# Patient Record
Sex: Male | Born: 1965 | Race: White | Hispanic: No | Marital: Married | Smoking: Never smoker
Health system: Southern US, Community
[De-identification: ages and names within clinical notes are randomized; demographics above are authoritative.]

## PROBLEM LIST (undated history)

## (undated) DIAGNOSIS — N200 Calculus of kidney: Secondary | ICD-10-CM

## (undated) DIAGNOSIS — E559 Vitamin D deficiency, unspecified: Secondary | ICD-10-CM

## (undated) DIAGNOSIS — M109 Gout, unspecified: Secondary | ICD-10-CM

## (undated) DIAGNOSIS — F419 Anxiety disorder, unspecified: Secondary | ICD-10-CM

## (undated) DIAGNOSIS — E7800 Pure hypercholesterolemia, unspecified: Secondary | ICD-10-CM

## (undated) HISTORY — DX: Anxiety disorder, unspecified: F41.9

## (undated) HISTORY — DX: Vitamin D deficiency, unspecified: E55.9

## (undated) HISTORY — PX: OTHER SURGICAL HISTORY: SHX169

---

## 2011-04-06 ENCOUNTER — Ambulatory Visit (HOSPITAL_COMMUNITY)
Admission: RE | Admit: 2011-04-06 | Discharge: 2011-04-06 | Disposition: A | Discharge status: Home / Self Care | Payer: Managed Care, Other (non HMO) | Source: Ambulatory Visit | Attending: Urology | Admitting: Urology

## 2011-04-06 ENCOUNTER — Other Ambulatory Visit (HOSPITAL_COMMUNITY): Payer: Self-pay | Admitting: Urology

## 2011-04-06 DIAGNOSIS — N201 Calculus of ureter: Secondary | ICD-10-CM

## 2013-03-08 IMAGING — CR DG ABDOMEN 1V
2 series · 2 of 2 positions shown · non-contrast
Comparison: None.

CLINICAL DATA: Left ureteral calculus

ABDOMEN - 1 VIEW

[view not recorded (1 of 2)]
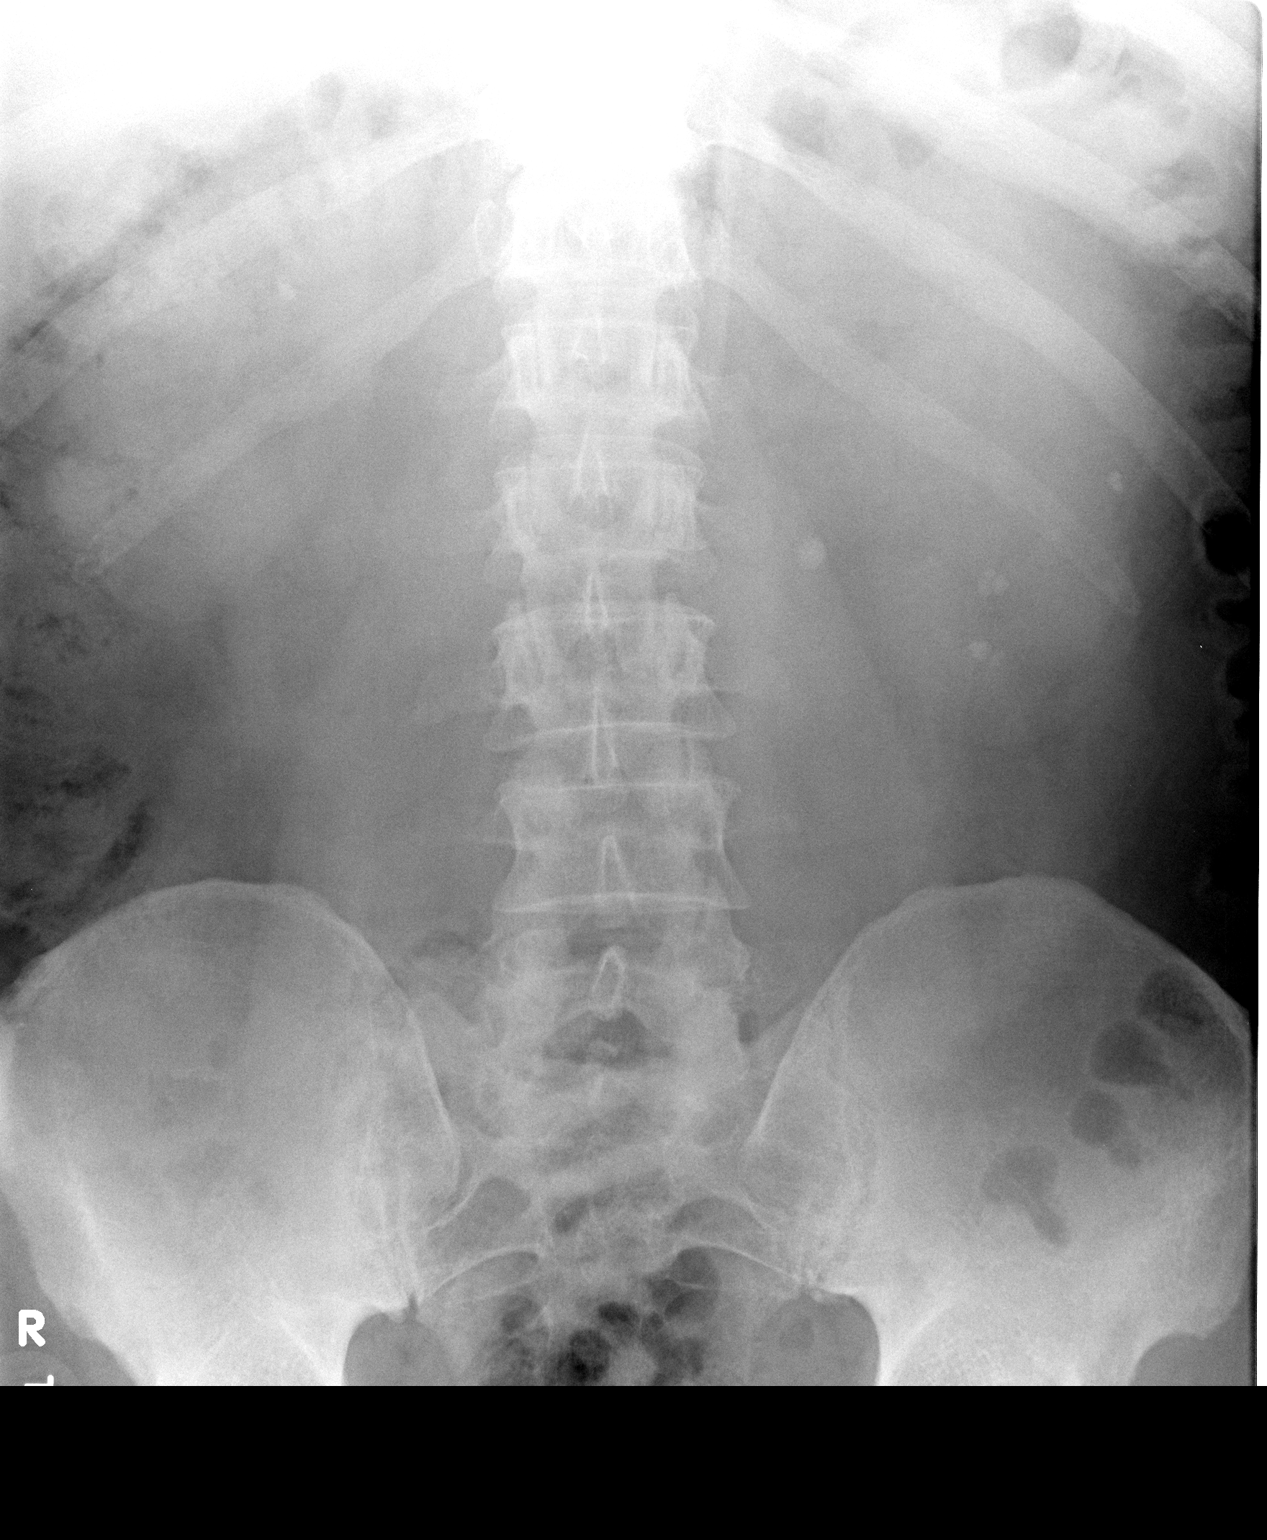

[view not recorded (2 of 2)]
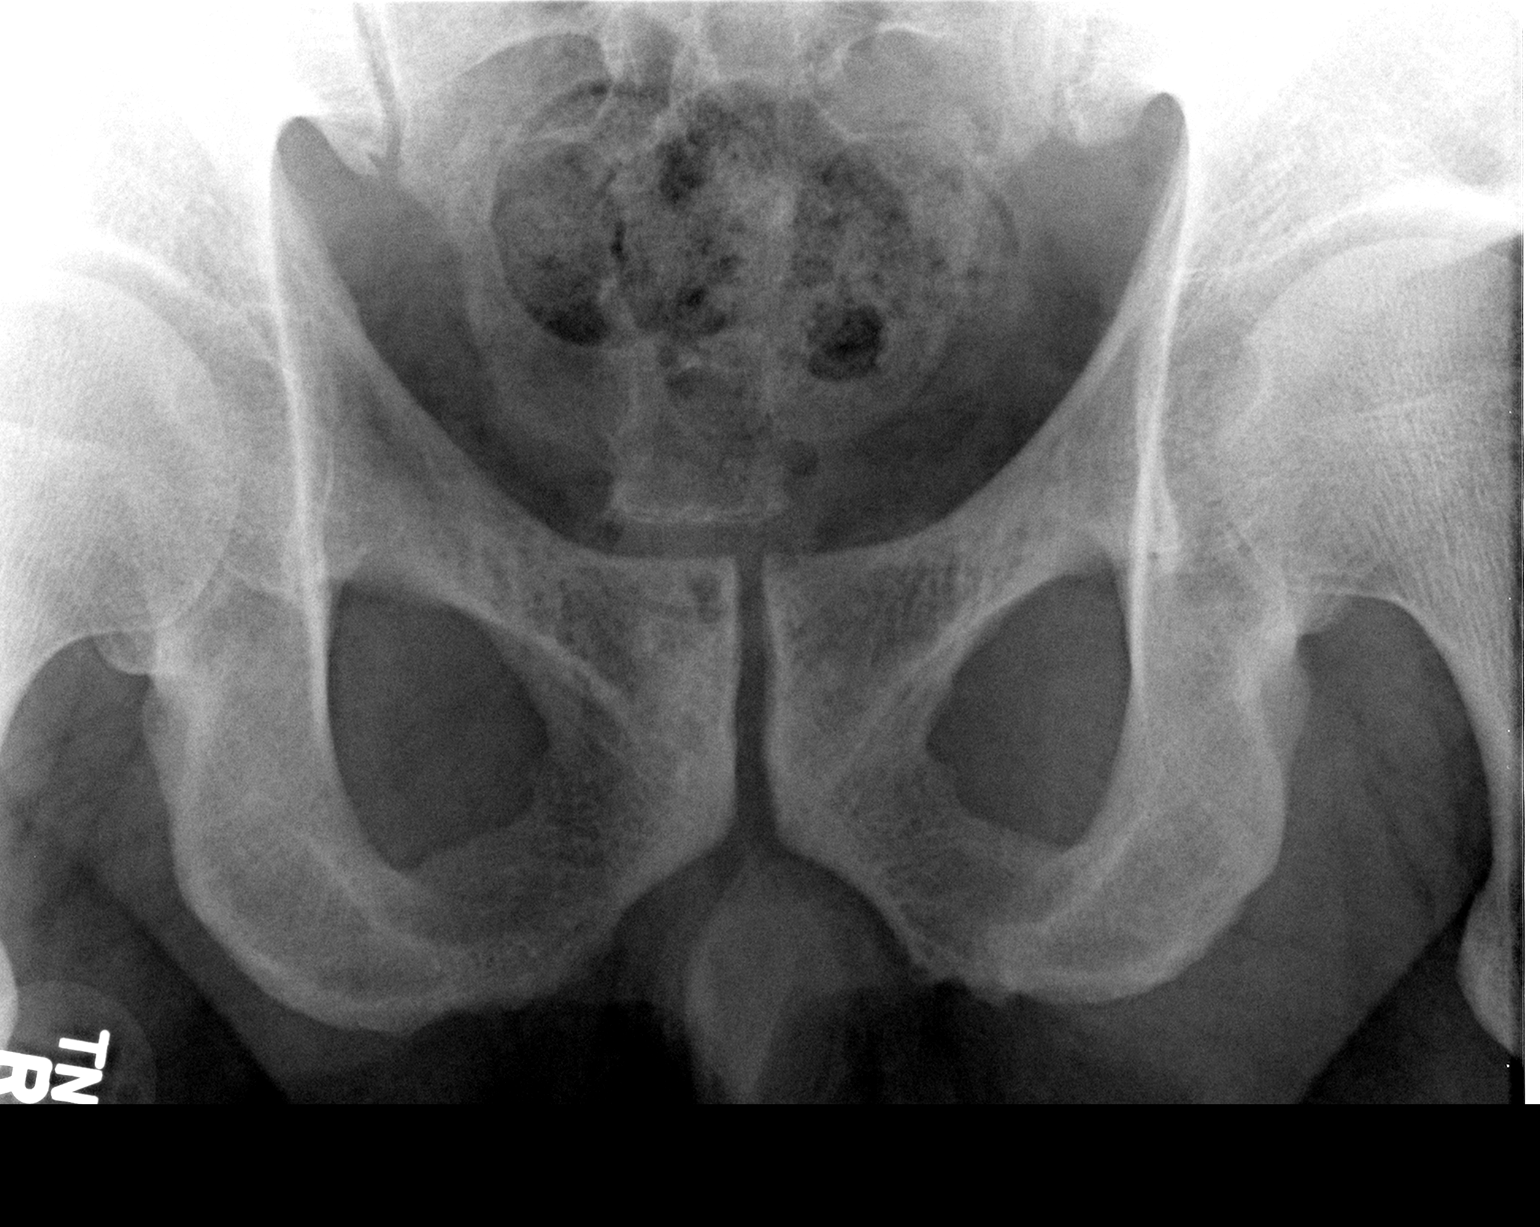

[2 of 2 positions shown; findings below may reference images not displayed]

FINDINGS: 9 mm calculus in the left proximal collecting system.

Additional smaller calculi overlying the left interpolar
region/lower pole.  Additional calculi overlying the right upper
pole.

No definite distal ureteral calculus is seen.
IMPRESSION: 9 mm calculus in the left proximal collecting system.

Additional smaller calculi bilaterally, as described above.

No definite distal ureteral calculus is seen.

## 2016-05-05 ENCOUNTER — Encounter (INDEPENDENT_AMBULATORY_CARE_PROVIDER_SITE_OTHER): Payer: Self-pay | Admitting: *Deleted

## 2016-06-04 ENCOUNTER — Encounter (INDEPENDENT_AMBULATORY_CARE_PROVIDER_SITE_OTHER): Payer: Self-pay | Admitting: *Deleted

## 2016-06-05 ENCOUNTER — Other Ambulatory Visit (INDEPENDENT_AMBULATORY_CARE_PROVIDER_SITE_OTHER): Payer: Self-pay | Admitting: *Deleted

## 2016-06-05 DIAGNOSIS — Z1211 Encounter for screening for malignant neoplasm of colon: Secondary | ICD-10-CM

## 2016-08-21 ENCOUNTER — Telehealth (INDEPENDENT_AMBULATORY_CARE_PROVIDER_SITE_OTHER): Payer: Self-pay | Admitting: *Deleted

## 2016-08-21 ENCOUNTER — Encounter (INDEPENDENT_AMBULATORY_CARE_PROVIDER_SITE_OTHER): Payer: Self-pay | Admitting: *Deleted

## 2016-08-21 NOTE — Telephone Encounter (Signed)
Patient needs trilyte 

## 2016-08-24 MED ORDER — PEG 3350-KCL-NA BICARB-NACL 420 G PO SOLR: 4000.0000 mL | Freq: Once | ORAL | 0 refills | Status: AC

## 2016-09-14 ENCOUNTER — Telehealth (INDEPENDENT_AMBULATORY_CARE_PROVIDER_SITE_OTHER): Payer: Self-pay | Admitting: *Deleted

## 2016-09-14 NOTE — Telephone Encounter (Signed)
Referring MD/PCP: diane blair, np   Procedure: tcs  Reason/Indication:  screening  Has patient had this procedure before?  no  If so, when, by whom and where?    Is there a family history of colon cancer?  no  Who?  What age when diagnosed?    Is patient diabetic?   no      Does patient have prosthetic heart valve or mechanical valve?  no  Do you have a pacemaker?  no  Has patient ever had endocarditis? no  Has patient had joint replacement within last 12 months?  no  Does patient tend to be constipated or take laxatives? no  Does patient have a history of alcohol/drug use?  no  Is patient on Coumadin, Plavix and/or Aspirin? no  Medications: trilipix 135 mg daily, nabumetone 500 mg bid, allopurinol 300 mg bid, krill oil 500 mg daily, vit d 1000 iu daily  Allergies: nkda  Medication Adjustment per Dr Laural Golden:   Procedure date & time: 10/14/16 at 930

## 2016-09-14 NOTE — Telephone Encounter (Signed)
agree

## 2016-10-14 ENCOUNTER — Ambulatory Visit (HOSPITAL_COMMUNITY)
Admission: RE | Admit: 2016-10-14 | Discharge: 2016-10-14 | Disposition: A | Discharge status: Home / Self Care | Payer: Managed Care, Other (non HMO) | Source: Ambulatory Visit | Attending: Internal Medicine | Admitting: Internal Medicine

## 2016-10-14 ENCOUNTER — Encounter (HOSPITAL_COMMUNITY): Payer: Self-pay | Admitting: *Deleted

## 2016-10-14 ENCOUNTER — Encounter (HOSPITAL_COMMUNITY)
Admission: RE | Disposition: A | Discharge status: Home / Self Care | Payer: Self-pay | Source: Ambulatory Visit | Attending: Internal Medicine

## 2016-10-14 DIAGNOSIS — D122 Benign neoplasm of ascending colon: Secondary | ICD-10-CM | POA: Insufficient documentation

## 2016-10-14 DIAGNOSIS — Z79899 Other long term (current) drug therapy: Secondary | ICD-10-CM | POA: Diagnosis not present

## 2016-10-14 DIAGNOSIS — K5730 Diverticulosis of large intestine without perforation or abscess without bleeding: Secondary | ICD-10-CM | POA: Insufficient documentation

## 2016-10-14 DIAGNOSIS — E7800 Pure hypercholesterolemia, unspecified: Secondary | ICD-10-CM | POA: Insufficient documentation

## 2016-10-14 DIAGNOSIS — Z1211 Encounter for screening for malignant neoplasm of colon: Secondary | ICD-10-CM | POA: Diagnosis not present

## 2016-10-14 DIAGNOSIS — M109 Gout, unspecified: Secondary | ICD-10-CM | POA: Insufficient documentation

## 2016-10-14 DIAGNOSIS — D123 Benign neoplasm of transverse colon: Secondary | ICD-10-CM

## 2016-10-14 DIAGNOSIS — Z720 Tobacco use: Secondary | ICD-10-CM | POA: Insufficient documentation

## 2016-10-14 DIAGNOSIS — D120 Benign neoplasm of cecum: Secondary | ICD-10-CM | POA: Diagnosis not present

## 2016-10-14 HISTORY — PX: BIOPSY: SHX5522

## 2016-10-14 HISTORY — PX: POLYPECTOMY: SHX5525

## 2016-10-14 HISTORY — DX: Pure hypercholesterolemia, unspecified: E78.00

## 2016-10-14 HISTORY — PX: COLONOSCOPY: SHX5424

## 2016-10-14 HISTORY — DX: Calculus of kidney: N20.0

## 2016-10-14 HISTORY — DX: Gout, unspecified: M10.9

## 2016-10-14 SURGERY — COLONOSCOPY
Anesthesia: Moderate Sedation

## 2016-10-14 MED ORDER — STERILE WATER FOR IRRIGATION IR SOLN
Status: DC | PRN
  Administered 2016-10-14: 09:00:00

## 2016-10-14 MED ORDER — SODIUM CHLORIDE 0.9 % IV SOLN
INTRAVENOUS | Status: DC
  Administered 2016-10-14: 09:00:00 via INTRAVENOUS

## 2016-10-14 MED ORDER — MEPERIDINE HCL 50 MG/ML IJ SOLN
INTRAMUSCULAR | Status: DC | PRN
  Administered 2016-10-14 (×2): 25 mg via INTRAVENOUS

## 2016-10-14 MED ORDER — MIDAZOLAM HCL 5 MG/5ML IJ SOLN
INTRAMUSCULAR | Status: DC | PRN
  Administered 2016-10-14 (×2): 2 mg via INTRAVENOUS
  Administered 2016-10-14 (×2): 1 mg via INTRAVENOUS

## 2016-10-14 MED ORDER — MEPERIDINE HCL 50 MG/ML IJ SOLN
INTRAMUSCULAR | Status: AC
  Filled 2016-10-14: qty 1

## 2016-10-14 MED ORDER — MIDAZOLAM HCL 5 MG/5ML IJ SOLN
INTRAMUSCULAR | Status: AC
  Filled 2016-10-14: qty 10

## 2016-10-14 NOTE — Op Note (Signed)
[facility] Patient Name: Jimmy Rose Procedure Date: 10/14/2016 9:06 AM MRN: [id number] Date of Birth: 01/08/1966 Attending MD: Hildred Laser , MD CSN: [id number] Age: 51 Admit Type: Outpatient Procedure:                Colonoscopy Indications:              Screening for colorectal malignant neoplasm Providers:                Hildred Laser, MD, Otis Peak B. Sharon Seller, RN, Janeece Riggers, RN Referring MD:             Tempie Hoist, NP Medicines:                Meperidine 50 mg IV, Midazolam 6 mg IV Complications:            No immediate complications. Estimated Blood Loss:     Estimated blood loss was minimal. Procedure:                Pre-Anesthesia Assessment:                           - Prior to the procedure, a History and Physical                            was performed, and patient medications and                            allergies were reviewed. The patient's tolerance of                            previous anesthesia was also reviewed. The risks                            and benefits of the procedure and the sedation                            options and risks were discussed with the patient.                            All questions were answered, and informed consent                            was obtained. Prior Anticoagulants: The patient                            last took previous NSAID medication 3 days prior to                            the procedure. ASA Grade Assessment: II - A patient                            with mild systemic disease. After reviewing the  risks and benefits, the patient was deemed in                            satisfactory condition to undergo the procedure.                           After obtaining informed consent, the colonoscope                            was passed under direct vision. Throughout the                            procedure, the patient's blood pressure, pulse, and                           oxygen saturations were monitored continuously. The                            EC-[id number] ([id number]) scope was introduced through                            the anus and advanced to the the cecum, identified                            by appendiceal orifice and ileocecal valve. The                            colonoscopy was performed without difficulty. The                            patient tolerated the procedure well. The quality                            of the bowel preparation was good. The ileocecal                            valve, appendiceal orifice, and rectum were                            photographed. Scope In: 9:34:47 AM Scope Out: 10:00:35 AM Scope Withdrawal Time: 0 hours 22 minutes 40 seconds  Total Procedure Duration: 0 hours 25 minutes 48 seconds  Findings:      The perianal and digital rectal examinations were normal.      A 10 mm polyp was found in the proximal ascending colon. The polyp was       semi-sessile. The polyp was removed with a hot snare. The polyp was       removed with a piecemeal technique using a hot snare. Resection and       retrieval were complete. The pathology specimen was placed into Bottle       Number 1.      A small polyp was found in the splenic flexure. The polyp was sessile.       The polyp was removed with a cold snare. Resection and retrieval were  complete. The pathology specimen was placed into Bottle Number 3.      A few small-mouthed diverticula were found in the sigmoid colon.      The retroflexed view of the distal rectum and anal verge was normal and       showed no anal or rectal abnormalities.      An area of congested mucosa was found at the ileocecal valve. This was       biopsied with a cold forceps for histology. The pathology specimen was       placed into Bottle Number 2. Impression:               - One 10 mm polyp in the proximal ascending colon,                            removed with a  hot snare and removed piecemeal                            using a hot snare. Resected and retrieved.                           - One small polyp at the splenic flexure, removed                            with a cold snare. Resected and retrieved.                           - Diverticulosis in the sigmoid colon.                           - Biopsy taken from edematous mucosa at ileocecal                            valve. Moderate Sedation:      Moderate (conscious) sedation was administered by the endoscopy nurse       and supervised by the endoscopist. The following parameters were       monitored: oxygen saturation, heart rate, blood pressure, CO2       capnography and response to care. Total physician intraservice time was       32 minutes. Recommendation:           - Patient has a contact number available for                            emergencies. The signs and symptoms of potential                            delayed complications were discussed with the                            patient. Return to normal activities tomorrow.                            Written discharge instructions were provided to the  patient.                           - High fiber diet today.                           - Continue present medications.                           - No aspirin, ibuprofen, naproxen, or other                            non-steroidal anti-inflammatory drugs for 3 days                            after polyp removal.                           - Await pathology results.                           - Repeat colonoscopy date to be determined after                            pending pathology results are reviewed. Procedure Code(s):        --- Professional ---                           [phone], Colonoscopy, flexible; with removal of                            tumor(s), polyp(s), or other lesion(s) by snare                            technique                            99152, Moderate sedation services provided by the                            same physician or other qualified health care                            professional performing the diagnostic or                            therapeutic service that the sedation supports,                            requiring the presence of an independent trained                            observer to assist in the monitoring of the                            patient's level of consciousness and physiological  status; initial 15 minutes of intraservice time,                            patient age 35 years or older                           [phone], Moderate sedation services; each additional                            15 minutes intraservice time Diagnosis Code(s):        --- Professional ---                           Z12.11, Encounter for screening for malignant                            neoplasm of colon                           D12.2, Benign neoplasm of ascending colon                           D12.3, Benign neoplasm of transverse colon (hepatic                            flexure or splenic flexure)                           K57.30, Diverticulosis of large intestine without                            perforation or abscess without bleeding CPT copyright 2016 [employer]. All rights reserved. The codes documented in this report are preliminary and upon coder review may  be revised to meet current compliance requirements. Hildred Laser, MD Hildred Laser, MD 10/14/2016 10:12:30 AM This report has been signed electronically. Number of Addenda: 0

## 2016-10-14 NOTE — Discharge Instructions (Signed)
Colon Polyps Polyps are tissue growths inside the body. Polyps can grow in many places, including the large intestine (colon). A polyp may be a round bump or a mushroom-shaped growth. You could have one polyp or several. Most colon polyps are noncancerous (benign). However, some colon polyps can become cancerous over time. What are the causes? The exact cause of colon polyps is not known. What increases the risk? This condition is more likely to develop in people who:  Have a family history of colon cancer or colon polyps.  Are older than 69 or older than 45 if they are African American.  Have inflammatory bowel disease, such as ulcerative colitis or Crohn disease.  Are overweight.  Smoke cigarettes.  Do not get enough exercise.  Drink too much alcohol.  Eat a diet that is: ? High in fat and red meat. ? Low in fiber.  Had childhood cancer that was treated with abdominal radiation.  What are the signs or symptoms? Most polyps do not cause symptoms. If you have symptoms, they may include:  Blood coming from your rectum when having a bowel movement.  Blood in your stool.The stool may look dark red or black.  A change in bowel habits, such as constipation or diarrhea.  How is this diagnosed? This condition is diagnosed with a colonoscopy. This is a procedure that uses a lighted, flexible scope to look at the inside of your colon. How is this treated? Treatment for this condition involves removing any polyps that are found. Those polyps will then be tested for cancer. If cancer is found, your health care provider will talk to you about options for colon cancer treatment. Follow these instructions at home: Diet  Eat plenty of fiber, such as fruits, vegetables, and whole grains.  Eat foods that are high in calcium and vitamin D, such as milk, cheese, yogurt, eggs, liver, fish, and broccoli.  Limit foods high in fat, red meats, and processed meats, such as hot dogs, sausage,  bacon, and lunch meats.  Maintain a healthy weight, or lose weight if recommended by your health care provider. General instructions  Do not smoke cigarettes.  Do not drink alcohol excessively.  Keep all follow-up visits as told by your health care provider. This is important. This includes keeping regularly scheduled colonoscopies. Talk to your health care provider about when you need a colonoscopy.  Exercise every day or as told by your health care provider. Contact a health care provider if:  You have new or worsening bleeding during a bowel movement.  You have new or increased blood in your stool.  You have a change in bowel habits.  You unexpectedly lose weight. This information is not intended to replace advice given to you by your health care provider. Make sure you discuss any questions you have with your health care provider. Document Released: 10/02/2003 Document Revised: 06/13/2015 Document Reviewed: 11/26/2014 Elsevier Interactive Patient Education  2018 [employer].    Colonoscopy, Adult, Care After This sheet gives you information about how to care for yourself after your procedure. Your health care provider may also give you more specific instructions. If you have problems or questions, contact your health care provider. What can I expect after the procedure? After the procedure, it is common to have:  A small amount of blood in your stool for 24 hours after the procedure.  Some gas.  Mild abdominal cramping or bloating.  Follow these instructions at home: General instructions   For the first  24 hours after the procedure: ? Do not drive or use machinery. ? Do not sign important documents. ? Do not drink alcohol. ? Do your regular daily activities at a slower pace than normal. ? Eat soft, easy-to-digest foods. ? Rest often.  Take over-the-counter or prescription medicines only as told by your health care provider.  It is up to you to get the results  of your procedure. Ask your health care provider, or the department performing the procedure, when your results will be ready. Relieving cramping and bloating  Try walking around when you have cramps or feel bloated.  Apply heat to your abdomen as told by your health care provider. Use a heat source that your health care provider recommends, such as a moist heat pack or a heating pad. ? Place a towel between your skin and the heat source. ? Leave the heat on for 20-30 minutes. ? Remove the heat if your skin turns bright red. This is especially important if you are unable to feel pain, heat, or cold. You may have a greater risk of getting burned. Eating and drinking  Drink enough fluid to keep your urine clear or pale yellow.  Resume your normal diet as instructed by your health care provider. Avoid heavy or fried foods that are hard to digest.  Avoid drinking alcohol for as long as instructed by your health care provider. Contact a health care provider if:  You have blood in your stool 2-3 days after the procedure. Get help right away if:  You have more than a small spotting of blood in your stool.  You pass large blood clots in your stool.  Your abdomen is swollen.  You have nausea or vomiting.  You have a fever.  You have increasing abdominal pain that is not relieved with medicine. This information is not intended to replace advice given to you by your health care provider. Make sure you discuss any questions you have with your health care provider. Document Released: 08/20/2003 Document Revised: 09/30/2015 Document Reviewed: 03/19/2015 Elsevier Interactive Patient Education  2018 [employer]. No aspirin for 1 week. Avoid other NSAIDs for at least 3 days. Resume other medications as before. High fiber diet. No driving for 24 hours. Physician will call biopsy results.

## 2016-10-14 NOTE — H&P (Signed)
Jimmy Rose is an 51 y.o. male.   Chief Complaint: patient is here for colonoscopy. HPI: patient is 51 year old Caucasian male who is here for screening colonoscopy. He denies abdominal pain change in bowel habits or rectal bleeding. Family history is negative for CRC.  Past Medical History:  Diagnosis Date  . Gout   . Hypercholesteremia   . Kidney stones     Past Surgical History:  Procedure Laterality Date  . left finger reattached      Family History  Problem Relation Age of Onset  . Colon cancer Neg Hx    Social History:  reports that he has never smoked. His smokeless tobacco use includes Snuff. He reports that he drinks about 14.4 oz of alcohol per week . He reports that he does not use drugs.  Allergies: No Known Allergies  Medications Prior to Admission  Medication Sig Dispense Refill  . allopurinol (ZYLOPRIM) 300 MG tablet Take 300 mg by mouth daily.    . Choline Fenofibrate 135 MG capsule Take 135 mg by mouth daily.    Javier Docker Oil 500 MG CAPS Take 1 capsule by mouth daily.    . nabumetone (RELAFEN) 500 MG tablet Take 500 mg by mouth 2 (two) times daily.      No results found for this or any previous visit (from the past 48 hour(s)). No results found.  ROS  Blood pressure 122/89, pulse 65, temperature 98.9 F (37.2 C), temperature source Oral, resp. rate 12, height 5\' 10"  (1.778 m), weight 250 lb (113.4 kg), SpO2 96 %. Physical Exam  Constitutional: He appears well-developed and well-nourished.  HENT:  Mouth/Throat: Oropharynx is clear and moist.  Eyes: Conjunctivae are normal. No scleral icterus.  Neck: No thyromegaly present.  Cardiovascular: Normal rate, regular rhythm and normal heart sounds.   No murmur heard. Respiratory: Effort normal and breath sounds normal.  GI:  Abdomen is full. It is soft and nontender without organomegaly or masses  Musculoskeletal: He exhibits no edema.  Lymphadenopathy:    He has no cervical adenopathy.  Neurological:  He is alert.  Skin: Skin is warm and dry.     Assessment/Plan Average risk screening colonoscopy.  Hildred Laser, MD 10/14/2016, 9:25 AM

## 2016-10-16 ENCOUNTER — Encounter (HOSPITAL_COMMUNITY): Payer: Self-pay | Admitting: Internal Medicine

## 2019-10-19 ENCOUNTER — Encounter (INDEPENDENT_AMBULATORY_CARE_PROVIDER_SITE_OTHER): Payer: Self-pay | Admitting: *Deleted

## 2020-01-18 ENCOUNTER — Encounter (INDEPENDENT_AMBULATORY_CARE_PROVIDER_SITE_OTHER): Payer: Self-pay

## 2020-01-18 ENCOUNTER — Telehealth (INDEPENDENT_AMBULATORY_CARE_PROVIDER_SITE_OTHER): Payer: Self-pay

## 2020-01-18 ENCOUNTER — Other Ambulatory Visit (INDEPENDENT_AMBULATORY_CARE_PROVIDER_SITE_OTHER): Payer: Self-pay

## 2020-01-18 DIAGNOSIS — Z1211 Encounter for screening for malignant neoplasm of colon: Secondary | ICD-10-CM

## 2020-01-18 MED ORDER — SUPREP BOWEL PREP KIT 17.5-3.13-1.6 GM/177ML PO SOLN: 1.0000 | ORAL | 0 refills | Status: DC

## 2020-01-18 NOTE — Telephone Encounter (Signed)
Referring MD/PCP: diane blair   Procedure: tcs  Reason/Indication:  History of polyps  Has patient had this procedure before?  yes  If so, when, by whom and where?  2018  Is there a family history of colon cancer?  no  Who?  What age when diagnosed?    Is patient diabetic?   no      Does patient have prosthetic heart valve or mechanical valve?  no  Do you have a pacemaker/defibrillator?  no  Has patient ever had endocarditis/atrial fibrillation? no  Have you had a stroke/heart attack last 6 mths? no  Does patient use oxygen? no  Has patient had joint replacement within last 12 months?  no  Is patient constipated or do they take laxatives? no  Does patient have a history of alcohol/drug use?  no  Is patient on blood thinner such as Coumadin, Plavix and/or Aspirin? no  Medications: allopruinol 300 mg bid, fenofibric acid 135 mg daily, nabumetone 500 mg bid, krill oil 500 mg daily  Allergies: nkda  Procedure date & time: 02/07/20 7:30

## 2020-01-18 NOTE — Telephone Encounter (Signed)
LeighAnn Vann Okerlund, CMA  

## 2020-01-31 ENCOUNTER — Encounter (INDEPENDENT_AMBULATORY_CARE_PROVIDER_SITE_OTHER): Payer: Self-pay

## 2020-02-05 ENCOUNTER — Other Ambulatory Visit (HOSPITAL_COMMUNITY): Payer: Managed Care, Other (non HMO)

## 2020-02-05 ENCOUNTER — Encounter (HOSPITAL_COMMUNITY): Payer: Managed Care, Other (non HMO)

## 2020-04-08 ENCOUNTER — Encounter (HOSPITAL_COMMUNITY): Payer: Self-pay

## 2020-04-08 ENCOUNTER — Other Ambulatory Visit (HOSPITAL_COMMUNITY)
Admission: RE | Admit: 2020-04-08 | Discharge: 2020-04-08 | Disposition: A | Discharge status: Home / Self Care | Payer: Managed Care, Other (non HMO) | Source: Ambulatory Visit | Attending: Internal Medicine | Admitting: Internal Medicine

## 2020-04-08 ENCOUNTER — Other Ambulatory Visit: Payer: Self-pay

## 2020-04-08 ENCOUNTER — Encounter (HOSPITAL_COMMUNITY)
Admission: RE | Admit: 2020-04-08 | Discharge: 2020-04-08 | Disposition: A | Discharge status: Home / Self Care | Payer: Managed Care, Other (non HMO) | Source: Ambulatory Visit | Attending: Internal Medicine | Admitting: Internal Medicine

## 2020-04-08 DIAGNOSIS — Z01812 Encounter for preprocedural laboratory examination: Secondary | ICD-10-CM | POA: Insufficient documentation

## 2020-04-08 DIAGNOSIS — Z20822 Contact with and (suspected) exposure to covid-19: Secondary | ICD-10-CM | POA: Insufficient documentation

## 2020-04-09 LAB — SARS CORONAVIRUS 2 (TAT 6-24 HRS): SARS Coronavirus 2: NEGATIVE

## 2020-04-10 ENCOUNTER — Encounter (HOSPITAL_COMMUNITY): Payer: Self-pay | Admitting: Internal Medicine

## 2020-04-10 ENCOUNTER — Encounter (HOSPITAL_COMMUNITY)
Admission: RE | Disposition: A | Discharge status: Home / Self Care | Payer: Self-pay | Source: Ambulatory Visit | Attending: Internal Medicine

## 2020-04-10 ENCOUNTER — Ambulatory Visit (HOSPITAL_COMMUNITY): Payer: Managed Care, Other (non HMO) | Admitting: Certified Registered Nurse Anesthetist

## 2020-04-10 ENCOUNTER — Other Ambulatory Visit: Payer: Self-pay

## 2020-04-10 ENCOUNTER — Ambulatory Visit (HOSPITAL_COMMUNITY)
Admission: RE | Admit: 2020-04-10 | Discharge: 2020-04-10 | Disposition: A | Discharge status: Home / Self Care | Payer: Managed Care, Other (non HMO) | Source: Ambulatory Visit | Attending: Internal Medicine | Admitting: Internal Medicine

## 2020-04-10 DIAGNOSIS — Z86010 Personal history of colonic polyps: Secondary | ICD-10-CM | POA: Insufficient documentation

## 2020-04-10 DIAGNOSIS — Z09 Encounter for follow-up examination after completed treatment for conditions other than malignant neoplasm: Secondary | ICD-10-CM | POA: Insufficient documentation

## 2020-04-10 DIAGNOSIS — Z1211 Encounter for screening for malignant neoplasm of colon: Secondary | ICD-10-CM

## 2020-04-10 DIAGNOSIS — D128 Benign neoplasm of rectum: Secondary | ICD-10-CM | POA: Insufficient documentation

## 2020-04-10 DIAGNOSIS — K621 Rectal polyp: Secondary | ICD-10-CM | POA: Diagnosis not present

## 2020-04-10 DIAGNOSIS — F17290 Nicotine dependence, other tobacco product, uncomplicated: Secondary | ICD-10-CM | POA: Insufficient documentation

## 2020-04-10 DIAGNOSIS — D120 Benign neoplasm of cecum: Secondary | ICD-10-CM | POA: Diagnosis not present

## 2020-04-10 DIAGNOSIS — D125 Benign neoplasm of sigmoid colon: Secondary | ICD-10-CM | POA: Diagnosis not present

## 2020-04-10 DIAGNOSIS — D123 Benign neoplasm of transverse colon: Secondary | ICD-10-CM | POA: Insufficient documentation

## 2020-04-10 DIAGNOSIS — Z791 Long term (current) use of non-steroidal anti-inflammatories (NSAID): Secondary | ICD-10-CM | POA: Insufficient documentation

## 2020-04-10 DIAGNOSIS — K644 Residual hemorrhoidal skin tags: Secondary | ICD-10-CM | POA: Insufficient documentation

## 2020-04-10 DIAGNOSIS — M109 Gout, unspecified: Secondary | ICD-10-CM | POA: Insufficient documentation

## 2020-04-10 DIAGNOSIS — K5730 Diverticulosis of large intestine without perforation or abscess without bleeding: Secondary | ICD-10-CM | POA: Insufficient documentation

## 2020-04-10 HISTORY — PX: COLONOSCOPY WITH PROPOFOL: SHX5780

## 2020-04-10 HISTORY — PX: POLYPECTOMY: SHX5525

## 2020-04-10 LAB — HM COLONOSCOPY

## 2020-04-10 SURGERY — COLONOSCOPY WITH PROPOFOL
Anesthesia: General

## 2020-04-10 MED ORDER — CHLORHEXIDINE GLUCONATE CLOTH 2 % EX PADS: 6.0000 | MEDICATED_PAD | Freq: Once | CUTANEOUS | Status: DC

## 2020-04-10 MED ORDER — LACTATED RINGERS IV SOLN: INTRAVENOUS | Status: DC | PRN

## 2020-04-10 MED ORDER — LACTATED RINGERS IV SOLN: INTRAVENOUS | Status: DC

## 2020-04-10 MED ORDER — PROPOFOL 10 MG/ML IV BOLUS
INTRAVENOUS | Status: DC | PRN
  Administered 2020-04-10: 100 mg via INTRAVENOUS
  Administered 2020-04-10: 30 mg via INTRAVENOUS

## 2020-04-10 MED ORDER — PROPOFOL 500 MG/50ML IV EMUL
INTRAVENOUS | Status: DC | PRN
  Administered 2020-04-10: 125 ug/kg/min via INTRAVENOUS

## 2020-04-10 NOTE — Anesthesia Preprocedure Evaluation (Signed)
Anesthesia Evaluation  Patient identified by MRN, date of birth, ID band Patient awake    Reviewed: Allergy & Precautions, H&P , NPO status , Patient's Chart, lab work & pertinent test results, reviewed documented beta blocker date and time   Airway Mallampati: II  TM Distance: >3 FB Neck ROM: full    Dental no notable dental hx.    Pulmonary neg pulmonary ROS,    Pulmonary exam normal breath sounds clear to auscultation       Cardiovascular Exercise Tolerance: Good negative cardio ROS   Rhythm:regular Rate:Normal     Neuro/Psych negative neurological ROS  negative psych ROS   GI/Hepatic negative GI ROS, Neg liver ROS,   Endo/Other  Morbid obesity  Renal/GU Renal disease  negative genitourinary   Musculoskeletal   Abdominal   Peds  Hematology negative hematology ROS (+)   Anesthesia Other Findings   Reproductive/Obstetrics negative OB ROS                             Anesthesia Physical Anesthesia Plan  ASA: III  Anesthesia Plan: General   Post-op Pain Management:    Induction:   PONV Risk Score and Plan: Propofol infusion  Airway Management Planned:   Additional Equipment:   Intra-op Plan:   Post-operative Plan:   Informed Consent: I have reviewed the patients History and Physical, chart, labs and discussed the procedure including the risks, benefits and alternatives for the proposed anesthesia with the patient or authorized representative who has indicated his/her understanding and acceptance.     Dental Advisory Given  Plan Discussed with: CRNA  Anesthesia Plan Comments:         Anesthesia Quick Evaluation

## 2020-04-10 NOTE — Anesthesia Postprocedure Evaluation (Signed)
Anesthesia Post Note  Patient: Jimmy Mancia  Procedure(s) Performed: COLONOSCOPY WITH PROPOFOL (N/A ) POLYPECTOMY  Patient location during evaluation: Phase II Anesthesia Type: General Level of consciousness: awake and alert Pain management: satisfactory to patient Vital Signs Assessment: post-procedure vital signs reviewed and stable Respiratory status: spontaneous breathing and respiratory function stable Cardiovascular status: blood pressure returned to baseline and stable Postop Assessment: no apparent nausea or vomiting Anesthetic complications: no   No complications documented.   Last Vitals:  Vitals:   04/10/20 0751 04/10/20 0856  BP: 129/81 110/73  Pulse: 77 73  Resp: 17 13  Temp: 36.8 C 37 C  SpO2: 98% 95%    Last Pain:  Vitals:   04/10/20 0856  TempSrc: Oral  PainSc: 0-No pain                 Karna Dupes

## 2020-04-10 NOTE — Discharge Instructions (Signed)
No aspirin or NSAIDs for 24 hours. Resume other medications as before. High-fiber diet. No driving for 24 hours. Physician will call with biopsy results.     Colonoscopy, Adult, Care After This sheet gives you information about how to care for yourself after your procedure. Your doctor may also give you more specific instructions. If you have problems or questions, call your doctor. What can I expect after the procedure? After the procedure, it is common to have:  A small amount of blood in your poop (stool) for 24 hours.  Some gas.  Mild cramping or bloating in your belly (abdomen). Follow these instructions at home: Eating and drinking  Drink enough fluid to keep your pee (urine) pale yellow.  Follow instructions from your doctor about what you cannot eat or drink.  Return to your normal diet as told by your doctor. Avoid heavy or fried foods that are hard to digest.   Activity  Rest as told by your doctor.  Do not sit for a long time without moving. Get up to take short walks every 1-2 hours. This is important. Ask for help if you feel weak or unsteady.  Return to your normal activities as told by your doctor. Ask your doctor what activities are safe for you. To help cramping and bloating:  Try walking around.  Put heat on your belly as told by your doctor. Use the heat source that your doctor recommends, such as a moist heat pack or a heating pad. ? Put a towel between your skin and the heat source. ? Leave the heat on for 20-30 minutes. ? Remove the heat if your skin turns bright red. This is very important if you are unable to feel pain, heat, or cold. You may have a greater risk of getting burned.   General instructions  If you were given a medicine to help you relax (sedative) during your procedure, it can affect you for many hours. Do not drive or use machinery until your doctor says that it is safe.  For the first 24 hours after the procedure: ? Do not sign  important documents. ? Do not drink alcohol. ? Do your daily activities more slowly than normal. ? Eat foods that are soft and easy to digest.  Take over-the-counter or prescription medicines only as told by your doctor.  Keep all follow-up visits as told by your doctor. This is important. Contact a doctor if:  You have blood in your poop 2-3 days after the procedure. Get help right away if:  You have more than a small amount of blood in your poop.  You see large clumps of tissue (blood clots) in your poop.  Your belly is swollen.  You feel like you may vomit (nauseous).  You vomit.  You have a fever.  You have belly pain that gets worse, and medicine does not help your pain. Summary  After the procedure, it is common to have a small amount of blood in your poop. You may also have mild cramping and bloating in your belly.  If you were given a medicine to help you relax (sedative) during your procedure, it can affect you for many hours. Do not drive or use machinery until your doctor says that it is safe.  Get help right away if you have a lot of blood in your poop, feel like you may vomit, have a fever, or have more belly pain. This information is not intended to replace advice given to you  by your health care provider. Make sure you discuss any questions you have with your health care provider. Document Revised: 11/11/2018 Document Reviewed: 08/01/2018 Elsevier Patient Education  [place].      High-Fiber Eating Plan Fiber, also called dietary fiber, is a type of carbohydrate. It is found foods such as fruits, vegetables, whole grains, and beans. A high-fiber diet can have many health benefits. Your health care provider may recommend a high-fiber diet to help:  Prevent constipation. Fiber can make your bowel movements more regular.  Lower your cholesterol.  Relieve the following conditions: ? Inflammation of veins in the anus (hemorrhoids). ? Inflammation  of specific areas of the digestive tract (uncomplicated diverticulosis). ? A problem of the large intestine, also called the colon, that sometimes causes pain and diarrhea (irritable bowel syndrome, or IBS).  Prevent overeating as part of a weight-loss plan.  Prevent heart disease, type 2 diabetes, and certain cancers. What are tips for following this plan? Reading food labels  Check the nutrition facts label on food products for the amount of dietary fiber. Choose foods that have 5 grams of fiber or more per serving.  The goals for recommended daily fiber intake include: ? Men (age 60 or younger): 34-38 g. ? Men (over age 66): 28-34 g. ? Women (age 27 or younger): 25-28 g. ? Women (over age 69): 22-25 g. Your daily fiber goal is _____________ g.   Shopping  Choose whole fruits and vegetables instead of processed forms, such as apple juice or applesauce.  Choose a wide variety of high-fiber foods such as avocados, lentils, oats, and kidney beans.  Read the nutrition facts label of the foods you choose. Be aware of foods with added fiber. These foods often have high sugar and sodium amounts per serving. Cooking  Use whole-grain flour for baking and cooking.  Cook with brown rice instead of white rice. Meal planning  Start the day with a breakfast that is high in fiber, such as a cereal that contains 5 g of fiber or more per serving.  Eat breads and cereals that are made with whole-grain flour instead of refined flour or white flour.  Eat brown rice, bulgur wheat, or millet instead of white rice.  Use beans in place of meat in soups, salads, and pasta dishes.  Be sure that half of the grains you eat each day are whole grains. General information  You can get the recommended daily intake of dietary fiber by: ? Eating a variety of fruits, vegetables, grains, nuts, and beans. ? Taking a fiber supplement if you are not able to take in enough fiber in your diet. It is better to  get fiber through food than from a supplement.  Gradually increase how much fiber you consume. If you increase your intake of dietary fiber too quickly, you may have bloating, cramping, or gas.  Drink plenty of water to help you digest fiber.  Choose high-fiber snacks, such as berries, raw vegetables, nuts, and popcorn. What foods should I eat? Fruits Berries. Pears. Apples. Oranges. Avocado. Prunes and raisins. Dried figs. Vegetables Sweet potatoes. Spinach. Kale. Artichokes. Cabbage. Broccoli. Cauliflower. Green peas. Carrots. Squash. Grains Whole-grain breads. Multigrain cereal. Oats and oatmeal. Brown rice. Barley. Bulgur wheat. [place]. Quinoa. Bran muffins. Popcorn. Rye wafer crackers. Meats and other proteins Navy beans, kidney beans, and pinto beans. Soybeans. Split peas. Lentils. Nuts and seeds. Dairy Fiber-fortified yogurt. Beverages Fiber-fortified soy milk. Fiber-fortified orange juice. Other foods Fiber bars. The items listed  above may not be a complete list of recommended foods and beverages. Contact a dietitian for more information. What foods should I avoid? Fruits Fruit juice. Cooked, strained fruit. Vegetables Fried potatoes. Canned vegetables. Well-cooked vegetables. Grains White bread. Pasta made with refined flour. White rice. Meats and other proteins Fatty cuts of meat. Fried chicken or fried fish. Dairy Milk. Yogurt. Cream cheese. Sour cream. Fats and oils Butters. Beverages Soft drinks. Other foods Cakes and pastries. The items listed above may not be a complete list of foods and beverages to avoid. Talk with your dietitian about what choices are best for you. Summary  Fiber is a type of carbohydrate. It is found in foods such as fruits, vegetables, whole grains, and beans.  A high-fiber diet has many benefits. It can help to prevent constipation, lower blood cholesterol, aid weight loss, and reduce your risk of heart disease, diabetes, and certain  cancers.  Increase your intake of fiber gradually. Increasing fiber too quickly may cause cramping, bloating, and gas. Drink plenty of water while you increase the amount of fiber you consume.  The best sources of fiber include whole fruits and vegetables, whole grains, nuts, seeds, and beans. This information is not intended to replace advice given to you by your health care provider. Make sure you discuss any questions you have with your health care provider. Document Revised: 05/11/2019 Document Reviewed: 05/11/2019 Elsevier Patient Education  2021 [place].    Colon Polyps  Colon polyps are tissue growths inside the colon, which is part of the large intestine. They are one of the types of polyps that can grow in the body. A polyp may be a round bump or a mushroom-shaped growth. You could have one polyp or more than one. Most colon polyps are noncancerous (benign). However, some colon polyps can become cancerous over time. Finding and removing the polyps early can help prevent this. What are the causes? The exact cause of colon polyps is not known. What increases the risk? The following factors may make you more likely to develop this condition:  Having a family history of colorectal cancer or colon polyps.  Being older than 55 years of age.  Being younger than 55 years of age and having a significant family history of colorectal cancer or colon polyps or a genetic condition that puts you at higher risk of getting colon polyps.  Having inflammatory bowel disease, such as ulcerative colitis or Crohn's disease.  Having certain conditions passed from parent to child (hereditary conditions), such as: ? Familial adenomatous polyposis (FAP). ? Lynch syndrome. ? Turcot syndrome. ? Peutz-Jeghers syndrome. ? MUTYH-associated polyposis (MAP).  Being overweight.  Certain lifestyle factors. These include smoking cigarettes, drinking too much alcohol, not getting enough exercise, and  eating a diet that is high in fat and red meat and low in fiber.  Having had childhood cancer that was treated with radiation of the abdomen. What are the signs or symptoms? Many times, there are no symptoms. If you have symptoms, they may include:  Blood coming from the rectum during a bowel movement.  Blood in the stool (feces). The blood may be bright red or very dark in color.  Pain in the abdomen.  A change in bowel habits, such as constipation or diarrhea. How is this diagnosed? This condition is diagnosed with a colonoscopy. This is a procedure in which a lighted, flexible scope is inserted into the opening between the buttocks (anus) and then passed into the colon to examine  the area. Polyps are sometimes found when a colonoscopy is done as part of routine cancer screening tests. How is this treated? This condition is treated by removing any polyps that are found. Most polyps can be removed during a colonoscopy. Those polyps will then be tested for cancer. Additional treatment may be needed depending on the results of testing. Follow these instructions at home: Eating and drinking  Eat foods that are high in fiber, such as fruits, vegetables, and whole grains.  Eat foods that are high in calcium and vitamin D, such as milk, cheese, yogurt, eggs, liver, fish, and broccoli.  Limit foods that are high in fat, such as fried foods and desserts.  Limit the amount of red meat, precooked or cured meat, or other processed meat that you eat, such as hot dogs, sausages, bacon, or meat loaves.  Limit sugary drinks.   Lifestyle  Maintain a healthy weight, or lose weight if recommended by your health care provider.  Exercise every day or as told by your health care provider.  Do not use any products that contain nicotine or tobacco, such as cigarettes, e-cigarettes, and chewing tobacco. If you need help quitting, ask your health care provider.  Do not drink alcohol if: ? Your health  care provider tells you not to drink. ? You are pregnant, may be pregnant, or are planning to become pregnant.  If you drink alcohol: ? Limit how much you use to:  0-1 drink a day for women.  0-2 drinks a day for men. ? Know how much alcohol is in your drink. In the [place] one drink equals one 12 oz bottle of beer (355 mL), one 5 oz glass of wine (148 mL), or one 1 oz glass of hard liquor (44 mL). General instructions  Take over-the-counter and prescription medicines only as told by your health care provider.  Keep all follow-up visits. This is important. This includes having regularly scheduled colonoscopies. Talk to your health care provider about when you need a colonoscopy. Contact a health care provider if:  You have new or worsening bleeding during a bowel movement.  You have new or increased blood in your stool.  You have a change in bowel habits.  You lose weight for no known reason. Summary  Colon polyps are tissue growths inside the colon, which is part of the large intestine. They are one type of polyp that can grow in the body.  Most colon polyps are noncancerous (benign), but some can become cancerous over time.  This condition is diagnosed with a colonoscopy.  This condition is treated by removing any polyps that are found. Most polyps can be removed during a colonoscopy. This information is not intended to replace advice given to you by your health care provider. Make sure you discuss any questions you have with your health care provider. Document Revised: 04/26/2019 Document Reviewed: 04/26/2019 Elsevier Patient Education  2021 [place] After This sheet gives you information about how to care for yourself after your procedure. Your health care provider may also give you more specific instructions. If you have problems or questions, contact your health care provider. What can I expect after the procedure? After the  procedure, it is common to have:  Tiredness.  Forgetfulness about what happened after the procedure.  Impaired judgment for important decisions.  Nausea or vomiting.  Some difficulty with balance. Follow these instructions at home: For the time period you were told by  your health care provider:  Rest as needed.  Do not participate in activities where you could fall or become injured.  Do not drive or use machinery.  Do not drink alcohol.  Do not take sleeping pills or medicines that cause drowsiness.  Do not make important decisions or sign legal documents.  Do not take care of children on your own.      Eating and drinking  Follow the diet that is recommended by your health care provider.  Drink enough fluid to keep your urine pale yellow.  If you vomit: ? Drink water, juice, or soup when you can drink without vomiting. ? Make sure you have little or no nausea before eating solid foods. General instructions  Have a responsible adult stay with you for the time you are told. It is important to have someone help care for you until you are awake and alert.  Take over-the-counter and prescription medicines only as told by your health care provider.  If you have sleep apnea, surgery and certain medicines can increase your risk for breathing problems. Follow instructions from your health care provider about wearing your sleep device: ? Anytime you are sleeping, including during daytime naps. ? While taking prescription pain medicines, sleeping medicines, or medicines that make you drowsy.  Avoid smoking.  Keep all follow-up visits as told by your health care provider. This is important. Contact a health care provider if:  You keep feeling nauseous or you keep vomiting.  You feel light-headed.  You are still sleepy or having trouble with balance after 24 hours.  You develop a rash.  You have a fever.  You have redness or swelling around the IV site. Get help  right away if:  You have trouble breathing.  You have new-onset confusion at home. Summary  For several hours after your procedure, you may feel tired. You may also be forgetful and have poor judgment.  Have a responsible adult stay with you for the time you are told. It is important to have someone help care for you until you are awake and alert.  Rest as told. Do not drive or operate machinery. Do not drink alcohol or take sleeping pills.  Get help right away if you have trouble breathing, or if you suddenly become confused. This information is not intended to replace advice given to you by your health care provider. Make sure you discuss any questions you have with your health care provider. Document Revised: 09/21/2019 Document Reviewed: 12/08/2018 Elsevier Patient Education  2021 [employer].

## 2020-04-10 NOTE — Transfer of Care (Signed)
Immediate Anesthesia Transfer of Care Note  Patient: Jimmy Rose  Procedure(s) Performed: COLONOSCOPY WITH PROPOFOL (N/A ) POLYPECTOMY  Patient Location: PACU  Anesthesia Type:General  Level of Consciousness: awake, alert  and oriented  Airway & Oxygen Therapy: Patient Spontanous Breathing  Post-op Assessment: Report given to RN and Post -op Vital signs reviewed and stable  Post vital signs: Reviewed and stable  Last Vitals:  Vitals Value Taken Time  BP 110/73 04/10/20 0856  Temp 37 C 04/10/20 0856  Pulse 73 04/10/20 0856  Resp 13 04/10/20 0856  SpO2 95 % 04/10/20 0856    Last Pain:  Vitals:   04/10/20 0856  TempSrc: Oral  PainSc: 0-No pain      Patients Stated Pain Goal: 10 (22/56/72 0919)  Complications: No complications documented.

## 2020-04-10 NOTE — H&P (Signed)
Jimmy Rose is an 55 y.o. male.   Chief Complaint: Patient is here for colonoscopy. HPI: Patient is 55 year old Caucasian male who underwent screening colonoscopy in December 2018 with removal of 4 polyps and 3 were tubular adenomas.  1 of these polyps was located at ileocecal valve.  He is now returning for surveillance colonoscopy.  He denies abdominal pain change in bowel habits or rectal bleeding. He is on nabumetone daily but does not take aspirin or anticoagulants. Family history is negative for CRC.  Past Medical History:  Diagnosis Date  . Gout   . Hypercholesteremia   . Kidney stones     Past Surgical History:  Procedure Laterality Date  . BIOPSY  10/14/2016   Procedure: BIOPSY;  Surgeon: Rogene Houston, MD;  Location: [facility];  Service: Endoscopy;;  colon  . COLONOSCOPY N/A 10/14/2016   Procedure: COLONOSCOPY;  Surgeon: Rogene Houston, MD;  Location: [facility];  Service: Endoscopy;  Laterality: N/A;  930  . left finger reattached    . POLYPECTOMY  10/14/2016   Procedure: POLYPECTOMY;  Surgeon: Rogene Houston, MD;  Location: [facility];  Service: Endoscopy;;  colon    Family History  Problem Relation Age of Onset  . Colon cancer Neg Hx    Social History:  reports that he has never smoked. His smokeless tobacco use includes snuff. He reports current alcohol use of about 24.0 standard drinks of alcohol per week. He reports that he does not use drugs.  Allergies: No Known Allergies  Medications Prior to Admission  Medication Sig Dispense Refill  . allopurinol (ZYLOPRIM) 300 MG tablet Take 600 mg by mouth daily.    . Choline Fenofibrate 135 MG capsule Take 135 mg by mouth daily.    Javier Docker Oil 500 MG CAPS Take 500 mg by mouth daily.    . nabumetone (RELAFEN) 500 MG tablet Take 1 tablet (500 mg total) by mouth 2 (two) times daily.    Marland Kitchen albuterol (VENTOLIN HFA) 108 (90 Base) MCG/ACT inhaler Inhale 1-2 puffs into the lungs every 6 (six) hours as needed for  wheezing or shortness of breath.    . Na Sulfate-K Sulfate-Mg Sulf (SUPREP BOWEL PREP KIT) 17.5-3.13-1.6 GM/177ML SOLN Take 1 kit by mouth as directed. (Patient not taking: Reported on 03/29/2020) 354 mL 0    Results for orders placed or performed during the hospital encounter of 04/08/20 (from the past 48 hour(s))  SARS CORONAVIRUS 2 (TAT 6-24 HRS) Nasopharyngeal Nasopharyngeal Swab     Status: None   Collection Time: 04/08/20  1:13 PM   Specimen: Nasopharyngeal Swab  Result Value Ref Range   SARS Coronavirus 2 NEGATIVE NEGATIVE    Comment: (NOTE) SARS-CoV-2 target nucleic acids are NOT DETECTED.  The SARS-CoV-2 RNA is generally detectable in upper and lower respiratory specimens during the acute phase of infection. Negative results do not preclude SARS-CoV-2 infection, do not rule out co-infections with other pathogens, and should not be used as the sole basis for treatment or other patient management decisions. Negative results must be combined with clinical observations, patient history, and epidemiological information. The expected result is Negative.  Fact Sheet for Patients: SugarRoll.be  Fact Sheet for Healthcare Providers: https://www.woods-mathews.com/  This test is not yet approved or cleared by the [place] FDA and  has been authorized for detection and/or diagnosis of SARS-CoV-2 by FDA under an Emergency Use Authorization (EUA). This EUA will remain  in effect (meaning this test can be used)  for the duration of the COVID-19 declaration under Se ction 564(b)(1) of the Act, 21 U.S.C. section 360bbb-3(b)(1), unless the authorization is terminated or revoked sooner.  Performed at [facility], [place] [address] [place], [place] [address]    No results found.  Review of Systems  Blood pressure 129/81, pulse 77, temperature 98.3 F (36.8 C), temperature source Oral, resp. rate 17, height _0  (1.778 m), weight 117  kg, SpO2 98 %. Physical Exam HENT:     Mouth/Throat:     Mouth: Mucous membranes are moist.     Pharynx: Oropharynx is clear.  Eyes:     General: No scleral icterus.    Conjunctiva/sclera: Conjunctivae normal.  Cardiovascular:     Rate and Rhythm: Normal rate and regular rhythm.     Heart sounds: Normal heart sounds. No murmur heard.   Pulmonary:     Effort: Pulmonary effort is normal.     Breath sounds: Normal breath sounds.  Abdominal:     Comments: Abdomen is full but soft and nontender with organomegaly or masses.  Musculoskeletal:        General: No swelling.     Cervical back: Neck supple.  Lymphadenopathy:     Cervical: No cervical adenopathy.  Skin:    General: Skin is warm and dry.  Neurological:     Mental Status: He is alert.      Assessment/Plan  History of colonic adenomas. Surveillance colonoscopy.  Hildred Laser, MD 04/10/2020, 8:19 AM

## 2020-04-10 NOTE — Op Note (Signed)
[facility] Patient Name: Jimmy Rose Procedure Date: 04/10/2020 8:07 AM MRN: [id number] Date of Birth: 08-Jun-1965 Attending MD: Hildred Laser , MD CSN: [id number] Age: 55 Admit Type: Outpatient Procedure:                Colonoscopy Indications:              High risk colon cancer surveillance: Personal                            history of colonic polyps Providers:                Hildred Laser, MD, Gwenlyn Fudge, RN, Randa Spike, Technician Referring MD:             Tempie Hoist, FNP Medicines:                Propofol per Anesthesia Complications:            No immediate complications. Estimated Blood Loss:     Estimated blood loss was minimal. Procedure:                Pre-Anesthesia Assessment:                           - Prior to the procedure, a History and Physical                            was performed, and patient medications and                            allergies were reviewed. The patient's tolerance of                            previous anesthesia was also reviewed. The risks                            and benefits of the procedure and the sedation                            options and risks were discussed with the patient.                            All questions were answered, and informed consent                            was obtained. Prior Anticoagulants: The patient has                            taken no previous anticoagulant or antiplatelet                            agents except for NSAID medication. ASA Grade  Assessment: II - A patient with mild systemic                            disease. After reviewing the risks and benefits,                            the patient was deemed in satisfactory condition to                            undergo the procedure.                           After obtaining informed consent, the colonoscope                            was passed under direct vision.  Throughout the                            procedure, the patient's blood pressure, pulse, and                            oxygen saturations were monitored continuously. The                            PCF-HQ190L ([id number]) scope was introduced through                            the anus and advanced to the the cecum, identified                            by appendiceal orifice and ileocecal valve. The                            colonoscopy was performed without difficulty. The                            patient tolerated the procedure well. The quality                            of the bowel preparation was adequate. The                            ileocecal valve, appendiceal orifice, and rectum                            were photographed. Scope In: 8:33:27 AM Scope Out: 8:52:30 AM Scope Withdrawal Time: 0 hours 16 minutes 59 seconds  Total Procedure Duration: 0 hours 19 minutes 3 seconds  Findings:      The perianal and digital rectal examinations were normal.      A small polyp was found in the ileocecal valve. The polyp was flat.       Biopsies were taken with a cold forceps for histology. The pathology       specimen was placed into Bottle Number 1.  Three polyps were found in the rectum, sigmoid colon and splenic       flexure. The polyps were small in size. These polyps were removed with a       cold snare. Resection and retrieval were complete. The pathology       specimen was placed into Bottle Number 2.      A few diverticula were found in the sigmoid colon and descending colon.      External hemorrhoids were found during retroflexion. The hemorrhoids       were small. Impression:               - One small polyp at the ileocecal valve. Biopsied.                           - Three small polyps in the rectum, in the sigmoid                            colon and at the splenic flexure, removed with a                            cold snare. Resected and retrieved.                            - Diverticulosis in the sigmoid colon and in the                            descending colon.                           - External hemorrhoids. Moderate Sedation:      Per Anesthesia Care Recommendation:           - Patient has a contact number available for                            emergencies. The signs and symptoms of potential                            delayed complications were discussed with the                            patient. Return to normal activities tomorrow.                            Written discharge instructions were provided to the                            patient.                           - High fiber diet today.                           - Continue present medications.                           - No aspirin, ibuprofen,  naproxen, or other                            non-steroidal anti-inflammatory drugs for 1 day.                           - Await pathology results.                           - Repeat colonoscopy is recommended. The                            colonoscopy date will be determined after pathology                            results from today's exam become available for                            review. Procedure Code(s):        --- Professional ---                           [phone], Colonoscopy, flexible; with removal of                            tumor(s), polyp(s), or other lesion(s) by snare                            technique                           45380, 10, Colonoscopy, flexible; with biopsy,                            single or multiple Diagnosis Code(s):        --- Professional ---                           Z86.010, Personal history of colonic polyps                           K63.5, Polyp of colon                           K62.1, Rectal polyp                           K64.4, Residual hemorrhoidal skin tags                           K57.30, Diverticulosis of large intestine without                            perforation or  abscess without bleeding CPT copyright 2019 [employer]. All rights reserved. The codes documented in this report are preliminary and upon coder review may  be revised to meet current compliance requirements. Hildred Laser, MD Hildred Laser, MD 04/10/2020 9:02:52 AM  This report has been signed electronically. Number of Addenda: 0

## 2020-04-11 LAB — SURGICAL PATHOLOGY

## 2020-04-15 ENCOUNTER — Encounter (INDEPENDENT_AMBULATORY_CARE_PROVIDER_SITE_OTHER): Payer: Self-pay | Admitting: *Deleted

## 2020-04-17 ENCOUNTER — Encounter (HOSPITAL_COMMUNITY): Payer: Self-pay | Admitting: Internal Medicine

## 2023-03-30 ENCOUNTER — Encounter (INDEPENDENT_AMBULATORY_CARE_PROVIDER_SITE_OTHER): Payer: Self-pay | Admitting: *Deleted

## 2023-04-21 ENCOUNTER — Ambulatory Visit (INDEPENDENT_AMBULATORY_CARE_PROVIDER_SITE_OTHER): Admitting: Gastroenterology

## 2023-04-21 ENCOUNTER — Encounter (INDEPENDENT_AMBULATORY_CARE_PROVIDER_SITE_OTHER): Payer: Self-pay | Admitting: Gastroenterology

## 2023-04-21 VITALS — BP 130/84 | HR 64 | Temp 98.0°F | Ht 70.0 in | Wt 257.0 lb

## 2023-04-21 DIAGNOSIS — F1090 Alcohol use, unspecified, uncomplicated: Secondary | ICD-10-CM | POA: Insufficient documentation

## 2023-04-21 DIAGNOSIS — R7401 Elevation of levels of liver transaminase levels: Secondary | ICD-10-CM

## 2023-04-21 DIAGNOSIS — R748 Abnormal levels of other serum enzymes: Secondary | ICD-10-CM | POA: Insufficient documentation

## 2023-04-21 DIAGNOSIS — Z860101 Personal history of adenomatous and serrated colon polyps: Secondary | ICD-10-CM | POA: Diagnosis not present

## 2023-04-21 DIAGNOSIS — Z6836 Body mass index (BMI) 36.0-36.9, adult: Secondary | ICD-10-CM | POA: Insufficient documentation

## 2023-04-21 DIAGNOSIS — Z860100 Personal history of colon polyps, unspecified: Secondary | ICD-10-CM | POA: Insufficient documentation

## 2023-04-21 DIAGNOSIS — K760 Fatty (change of) liver, not elsewhere classified: Secondary | ICD-10-CM | POA: Insufficient documentation

## 2023-04-21 NOTE — Progress Notes (Signed)
 Vista Lawman , M.D. Gastroenterology & Hepatology [facility] [address] [place], [place] [address] Primary Care Physician: Delorse Lek, FNP [address] [place] [address]  Chief Complaint:  Elevated liver enzymes  History of Present Illness: Jimmy Rose is a 58 y.o. male with DM, Gout and recurrrent renal stones who presents for evaluation of elevated liver enzymes   Patient is accompanying with my in the clinic.  Denies any prior liver disease history, denies generalized fatigue, yellowing of skin or generalized pruritus.  Patient has history of heavy alcohol use with 7-8 beers a day and had stopped drinking since February 27, 2023.The patient denies having any nausea, vomiting, fever, chills, hematochezia, melena, hematemesis, abdominal distention, abdominal pain, diarrhea, jaundice, pruritus or weight loss.  Last XBJ:YNWG Last Colonoscopy:2022. 3 TA's Repeat 5 years   FHx: neg for any gastrointestinal/liver disease, no malignancies Social: Heavy alcohol use,7-8 beers a day and had stopped drinking since February 27, 2023. Surgical: no abdominal surgeries  Past Medical History: Past Medical History:  Diagnosis Date   Anxiety    Gout    Hypercholesteremia    Kidney stones    Vitamin D deficiency     Past Surgical History: Past Surgical History:  Procedure Laterality Date   BIOPSY  10/14/2016   Procedure: BIOPSY;  Surgeon: Malissa Hippo, MD;  [facility];  Service: Endoscopy;;  colon   COLONOSCOPY N/A 10/14/2016   Procedure: COLONOSCOPY;  Surgeon: Malissa Hippo, MD;  [facility];  Service: Endoscopy;  Laterality: N/A;  930   COLONOSCOPY WITH PROPOFOL N/A 04/10/2020   Procedure: COLONOSCOPY WITH PROPOFOL;  Surgeon: Malissa Hippo, MD;  [facility];  Service: Endoscopy;  Laterality: N/A;  am   left finger reattached     POLYPECTOMY  10/14/2016   Procedure:  POLYPECTOMY;  Surgeon: Malissa Hippo, MD;  [facility];  Service: Endoscopy;;  colon   POLYPECTOMY  04/10/2020   Procedure: POLYPECTOMY;  Surgeon: Malissa Hippo, MD;  [facility];  Service: Endoscopy;;    Family History: Family History  Problem Relation Age of Onset   Asthma Father    Colon cancer Neg Hx     Social History: Social History   Tobacco Use  Smoking Status Never   Passive exposure: Current  Smokeless Tobacco Current   Types: Snuff   Social History   Substance and Sexual Activity  Alcohol Use Not Currently   Alcohol/week: 24.0 standard drinks of alcohol   Types: 24 Cans of beer per week   Comment: quit February 27, 2023   Social History   Substance and Sexual Activity  Drug Use No    Allergies: No Known Allergies  Medications: Current Outpatient Medications  Medication Sig Dispense Refill   albuterol (VENTOLIN HFA) 108 (90 Base) MCG/ACT inhaler Inhale 1-2 puffs into the lungs every 6 (six) hours as needed for wheezing or shortness of breath.     allopurinol (ZYLOPRIM) 300 MG tablet Take 600 mg by mouth daily.     Cholecalciferol (VITAMIN D3) 1.25 MG (50000 UT) CAPS Take 1 capsule by mouth once a week.     Choline Fenofibrate 135 MG capsule Take 135 mg by mouth daily.     Krill Oil 500 MG CAPS Take 500 mg by mouth daily.     nabumetone (RELAFEN) 500 MG tablet Take 1 tablet (500 mg total) by mouth 2 (two) times  daily.     sertraline (ZOLOFT) 50 MG tablet Take 50 mg by mouth daily.     No current facility-administered medications for this visit.    Review of Systems: GENERAL: negative for malaise, night sweats HEENT: No changes in hearing or vision, no nose bleeds or other nasal problems. NECK: Negative for lumps, goiter, pain and significant neck swelling RESPIRATORY: Negative for cough, wheezing CARDIOVASCULAR: Negative for chest pain, leg swelling, palpitations, orthopnea GI: SEE HPI MUSCULOSKELETAL: Negative for joint  pain or swelling, back pain, and muscle pain. SKIN: Negative for lesions, rash HEMATOLOGY Negative for prolonged bleeding, bruising easily, and swollen nodes. ENDOCRINE: Negative for cold or heat intolerance, polyuria, polydipsia and goiter. NEURO: negative for tremor, gait imbalance, syncope and seizures. The remainder of the review of systems is noncontributory.   Physical Exam: BP 130/84   Pulse 64   Temp 98 F (36.7 C) (Oral)   Ht 5\' 10"  (1.778 m)   Wt 257 lb (116.6 kg)   BMI 36.88 kg/m  GENERAL: The patient is AO x3, in no acute distress. HEENT: Head is normocephalic and atraumatic. EOMI are intact. Mouth is well hydrated and without lesions. NECK: Supple. No masses LUNGS: Clear to auscultation. No presence of rhonchi/wheezing/rales. Adequate chest expansion HEART: RRR, normal s1 and s2. ABDOMEN: Soft, nontender, no guarding, no peritoneal signs, and nondistended. BS +. No masses.  Imaging/Labs: as above      No data to display         No results found for: "IRON", "TIBC", "FERRITIN"  I personally reviewed and interpreted the available labs, imaging and endoscopic files.  Impression and Plan: Jimmy Rose is a 58 y.o. 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  Risk factors with BMI 36.8, and diabetes (hemoglobin A1c 7.0), hypertriglyceridemia and increased alcohol intake  Patient has hepatocellular pattern elevation liver enzymes. AST :54 ALT 78:    Fib 4 score : 2.29; further workup suggested    On exam patient does not have signs of advanced chronic liver disease, no splenomegaly, ascites, spider angiomas, palmar eythema    Will evaluate for CSPH (clinically significant portal hypertension)/degree of fibrosis  with NASH fibrosure    Will obtain  baseline viral hepatitis profile, autoimmune serologies and NASH fibrosure assess degree of fibrosis   #BMI 36  Had extensive discussion with patient about lifestyle changes      - walking at a brisk pace/biking at moderate intesity 2.5-5 hours per week     - use pedometer/step counter to track activity     - goal to lose 5-10% of initial body weight     - avoid suagry drinks and juices, use zero calorie beverages     - increase water intake     - eat a low carb diet with plenty of veggies and fruit     - Get sufficient sleep 7-8 hrs nightly     - maitain active lifestyle     - avoid alcohol     - recommend 2-3 cups Coffee daily     - Counsel on lowering cholesterol by having a diet rich in vegetables,          protein (avoid red meats) and good fats(fish, salmon).  #History of colon polyps  Last colonoscopy 2022 with 3 TA's suggest repeat  5 years   Orders Placed This Encounter  Procedures   US Abdomen Complete   ANA   Protime-INR   Iron, TIBC and Ferritin Panel   Mitochondrial antibodies   Comprehensive metabolic panel with GFR   Hepatitis A antibody, total   Hepatitis B core antibody, total   Hepatitis B surface antibody,qualitative   Hepatitis B surface antigen   Hepatitis C antibody   HIV Antibody (routine testing w rflx)   Anti-smooth muscle antibody, IgG   NASH FibroSure(R) Plus     All questions were answered.      Vista Lawman, MD Gastroenterology and Hepatology [facility]astroenter[facility]   This chart has been completed using [facility] Dictation software, and while attempts have been made to ensure accuracy , certain words and phrases may not be transcribed as intended

## 2023-04-21 NOTE — Patient Instructions (Signed)
 It was very nice to meet you today, as dicussed with will plan for the following :  1) labs and ultrasound  2)      - walking at a brisk pace/biking at moderate intesity 2.5-5 hours per week     - use pedometer/step counter to track activity     - goal to lose 5-10% of initial body weight     - avoid suagry drinks and juices, use zero calorie beverages     - increase water intake     - eat a low carb diet with plenty of veggies and fruit     - Get sufficient sleep 7-8 hrs nightly     - maitain active lifestyle     - avoid alcohol     - recommend 2-3 cups Coffee daily     - Counsel on lowering cholesterol by having a diet rich in vegetables,          protein (avoid red meats) and good fats(fish, salmon).

## 2023-04-23 LAB — NASH FIBROSURE(R) PLUS
ALPHA 2-MACROGLOBULINS, QN: 288 mg/dL — ABNORMAL HIGH (ref 110–276)
ALT (SGPT) P5P: 74 IU/L — ABNORMAL HIGH (ref 0–55)
AST (SGOT) P5P: 90 IU/L — ABNORMAL HIGH (ref 0–40)
Apolipoprotein A-1: 123 mg/dL (ref 101–178)
Bilirubin, Total: 0.4 mg/dL (ref 0.0–1.2)
Cholesterol, Total: 240 mg/dL — ABNORMAL HIGH (ref 100–199)
Fibrosis Score: 0.52 — ABNORMAL HIGH (ref 0.00–0.21)
GGT: 43 IU/L (ref 0–65)
Glucose: 145 mg/dL — ABNORMAL HIGH (ref 70–99)
Haptoglobin: 102 mg/dL (ref 29–370)
NASH Score: 0.88 — ABNORMAL HIGH (ref 0.00–0.25)
Steatosis Score: 0.79 — ABNORMAL HIGH (ref 0.00–0.40)
Triglycerides: 451 mg/dL — ABNORMAL HIGH (ref 0–149)

## 2023-04-26 ENCOUNTER — Encounter (INDEPENDENT_AMBULATORY_CARE_PROVIDER_SITE_OTHER): Payer: Self-pay | Admitting: Gastroenterology

## 2023-04-26 NOTE — Progress Notes (Signed)
 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

## 2023-04-27 LAB — COMPREHENSIVE METABOLIC PANEL WITH GFR
ALT: 67 IU/L — ABNORMAL HIGH (ref 0–44)
AST: 77 IU/L — ABNORMAL HIGH (ref 0–40)
Albumin: 4.6 g/dL (ref 3.8–4.9)
Alkaline Phosphatase: 95 IU/L (ref 44–121)
BUN/Creatinine Ratio: 23 — ABNORMAL HIGH (ref 9–20)
BUN: 22 mg/dL (ref 6–24)
Bilirubin Total: 0.5 mg/dL (ref 0.0–1.2)
CO2: 20 mmol/L (ref 20–29)
Calcium: 10 mg/dL (ref 8.7–10.2)
Chloride: 102 mmol/L (ref 96–106)
Creatinine, Ser: 0.96 mg/dL (ref 0.76–1.27)
Globulin, Total: 3.1 g/dL (ref 1.5–4.5)
Glucose: 139 mg/dL — ABNORMAL HIGH (ref 70–99)
Potassium: 4.9 mmol/L (ref 3.5–5.2)
Sodium: 140 mmol/L (ref 134–144)
Total Protein: 7.7 g/dL (ref 6.0–8.5)
eGFR: 92 mL/min/{1.73_m2} (ref >59)

## 2023-04-27 LAB — HEPATITIS B SURFACE ANTIGEN: Hepatitis B Surface Ag: NEGATIVE

## 2023-04-27 LAB — IRON,TIBC AND FERRITIN PANEL
Ferritin: 1210 ng/mL — ABNORMAL HIGH (ref 30–400)
Iron Saturation: 31 % (ref 15–55)
Iron: 119 ug/dL (ref 38–169)
Total Iron Binding Capacity: 387 ug/dL (ref 250–450)
UIBC: 268 ug/dL (ref 111–343)

## 2023-04-27 LAB — HEPATITIS B SURFACE ANTIBODY,QUALITATIVE: Hep B Surface Ab, Qual: NONREACTIVE

## 2023-04-27 LAB — ANTI-SMOOTH MUSCLE ANTIBODY, IGG: Smooth Muscle Ab: 6 U (ref 0–19)

## 2023-04-27 LAB — HIV ANTIBODY (ROUTINE TESTING W REFLEX): HIV Screen 4th Generation wRfx: NONREACTIVE

## 2023-04-27 LAB — MITOCHONDRIAL ANTIBODIES: Mitochondrial Ab: <20 U (ref 0.0–20.0)

## 2023-04-27 LAB — ANA: ANA Titer 1: NEGATIVE

## 2023-04-27 LAB — PROTIME-INR
INR: 1.1 (ref 0.9–1.2)
Prothrombin Time: 12 s (ref 9.1–12.0)

## 2023-04-27 LAB — HEPATITIS C ANTIBODY: Hep C Virus Ab: NONREACTIVE

## 2023-04-27 LAB — HEPATITIS A ANTIBODY, TOTAL: hep A Total Ab: NEGATIVE

## 2023-04-27 LAB — HEPATITIS B CORE ANTIBODY, TOTAL: Hep B Core Total Ab: NEGATIVE

## 2023-04-29 ENCOUNTER — Ambulatory Visit (HOSPITAL_COMMUNITY)
Admission: RE | Admit: 2023-04-29 | Discharge: 2023-04-29 | Disposition: A | Discharge status: Home / Self Care | Source: Ambulatory Visit | Attending: Gastroenterology | Admitting: Gastroenterology

## 2023-04-29 DIAGNOSIS — R748 Abnormal levels of other serum enzymes: Secondary | ICD-10-CM | POA: Diagnosis present

## 2023-04-29 DIAGNOSIS — K760 Fatty (change of) liver, not elsewhere classified: Secondary | ICD-10-CM | POA: Insufficient documentation

## 2023-05-03 ENCOUNTER — Encounter (INDEPENDENT_AMBULATORY_CARE_PROVIDER_SITE_OTHER): Payer: Self-pay | Admitting: Gastroenterology

## 2023-05-03 NOTE — Progress Notes (Signed)
 Ultrasound :  1. No acute abnormality identified. 2. 1 cm nonobstructing stone in the left kidney. 3. Increased echotexture of the liver. This is a nonspecific finding but can be seen in fatty infiltration of liver.  =======================  Hi Ann can you please fax this ultrasound report to PCP

## 2023-05-19 ENCOUNTER — Encounter (INDEPENDENT_AMBULATORY_CARE_PROVIDER_SITE_OTHER): Payer: Self-pay | Admitting: Gastroenterology

## 2023-05-19 ENCOUNTER — Ambulatory Visit (INDEPENDENT_AMBULATORY_CARE_PROVIDER_SITE_OTHER): Admitting: Gastroenterology

## 2023-05-19 VITALS — BP 135/85 | HR 69 | Temp 97.8°F | Ht 70.0 in | Wt 255.8 lb

## 2023-05-19 DIAGNOSIS — R7401 Elevation of levels of liver transaminase levels: Secondary | ICD-10-CM

## 2023-05-19 DIAGNOSIS — Z860101 Personal history of adenomatous and serrated colon polyps: Secondary | ICD-10-CM

## 2023-05-19 DIAGNOSIS — F1091 Alcohol use, unspecified, in remission: Secondary | ICD-10-CM | POA: Diagnosis not present

## 2023-05-19 DIAGNOSIS — Z6836 Body mass index (BMI) 36.0-36.9, adult: Secondary | ICD-10-CM | POA: Diagnosis not present

## 2023-05-19 DIAGNOSIS — R748 Abnormal levels of other serum enzymes: Secondary | ICD-10-CM

## 2023-05-19 DIAGNOSIS — K760 Fatty (change of) liver, not elsewhere classified: Secondary | ICD-10-CM

## 2023-05-19 DIAGNOSIS — K7581 Nonalcoholic steatohepatitis (NASH): Secondary | ICD-10-CM | POA: Diagnosis not present

## 2023-05-19 MED ORDER — REZDIFFRA 100 MG PO TABS: 100.0000 mg | ORAL_TABLET | Freq: Every day | ORAL | 3 refills | Status: AC

## 2023-05-19 NOTE — Progress Notes (Signed)
 Edilia Ghuman Faizan Rickardo Brinegar , M.D. Gastroenterology & Hepatology [facility] Gastroenterology [address] [place], [place] [address] Primary Care Physician: Daphney Eans, FNP [address] [place] [address]  Chief Complaint:  Elevated liver enzymes/MASH  History of Present Illness: Jimmy Rose is a 58 y.o. male with DM, Gout and recurrrent renal stones who presents for evaluation of elevated liver enzymes   Patient is accompanying with my in the clinic.  Denies any prior liver disease history, denies generalized fatigue, yellowing of skin or generalized pruritus.  Patient has history of heavy alcohol use with 7-8 beers a day and had stopped drinking since February 27, 2023.The patient denies having any nausea, vomiting, fever, chills, hematochezia, melena, hematemesis, abdominal distention, abdominal pain, diarrhea, jaundice, pruritus or weight loss.  Patient recently had blood work and ultrasound and is here for follow-up   Last NFA:OZHY Last Colonoscopy:2022. 3 TA's Repeat 5 years   FHx: neg for any gastrointestinal/liver disease, no malignancies Social: Heavy alcohol use,7-8 beers a day and had stopped drinking since February 27, 2023. Surgical: no abdominal surgeries  Past Medical History: Past Medical History:  Diagnosis Date   Anxiety    Gout    Hypercholesteremia    Kidney stones    Vitamin D deficiency     Past Surgical History: Past Surgical History:  Procedure Laterality Date   BIOPSY  10/14/2016   Procedure: BIOPSY;  Surgeon: Ruby Corporal, MD;  [facility];  Service: Endoscopy;;  colon   COLONOSCOPY N/A 10/14/2016   Procedure: COLONOSCOPY;  Surgeon: Ruby Corporal, MD;  [facility];  Service: Endoscopy;  Laterality: N/A;  930   COLONOSCOPY WITH PROPOFOL  N/A 04/10/2020   Procedure: COLONOSCOPY WITH PROPOFOL ;  Surgeon: Ruby Corporal, MD;  [facility];  Service: Endoscopy;   Laterality: N/A;  am   left finger reattached     POLYPECTOMY  10/14/2016   Procedure: POLYPECTOMY;  Surgeon: Ruby Corporal, MD;  [facility];  Service: Endoscopy;;  colon   POLYPECTOMY  04/10/2020   Procedure: POLYPECTOMY;  Surgeon: Ruby Corporal, MD;  [facility];  Service: Endoscopy;;    Family History: Family History  Problem Relation Age of Onset   Asthma Father    Colon cancer Neg Hx     Social History: Social History   Tobacco Use  Smoking Status Never   Passive exposure: Current  Smokeless Tobacco Current   Types: Snuff   Social History   Substance and Sexual Activity  Alcohol Use Not Currently   Alcohol/week: 24.0 standard drinks of alcohol   Types: 24 Cans of beer per week   Comment: quit February 27, 2023   Social History   Substance and Sexual Activity  Drug Use No    Allergies: No Known Allergies  Medications: Current Outpatient Medications  Medication Sig Dispense Refill   albuterol (VENTOLIN HFA) 108 (90 Base) MCG/ACT inhaler Inhale 1-2 puffs into the lungs every 6 (six) hours as needed for wheezing or shortness of breath.     allopurinol (ZYLOPRIM) 300 MG tablet Take 600 mg by mouth daily.     Cholecalciferol (VITAMIN D3) 1.25 MG (50000 UT) CAPS Take 1 capsule by mouth once a week.     Choline Fenofibrate 135 MG capsule Take 135 mg by mouth daily.     Krill Oil 500 MG CAPS Take 500 mg by mouth daily.     nabumetone (RELAFEN)  500 MG tablet Take 1 tablet (500 mg total) by mouth 2 (two) times daily.     Resmetirom (REZDIFFRA) 100 MG TABS Take 100 mg by mouth daily. 90 tablet 3   sertraline (ZOLOFT) 50 MG tablet Take 50 mg by mouth daily.     No current facility-administered medications for this visit.    Review of Systems: GENERAL: negative for malaise, night sweats HEENT: No changes in hearing or vision, no nose bleeds or other nasal problems. NECK: Negative for lumps, goiter, pain and significant neck  swelling RESPIRATORY: Negative for cough, wheezing CARDIOVASCULAR: Negative for chest pain, leg swelling, palpitations, orthopnea GI: SEE HPI MUSCULOSKELETAL: Negative for joint pain or swelling, back pain, and muscle pain. SKIN: Negative for lesions, rash HEMATOLOGY Negative for prolonged bleeding, bruising easily, and swollen nodes. ENDOCRINE: Negative for cold or heat intolerance, polyuria, polydipsia and goiter. NEURO: negative for tremor, gait imbalance, syncope and seizures. The remainder of the review of systems is noncontributory.   Physical Exam: BP 135/85   Pulse 69   Temp 97.8 F (36.6 C)   Ht 5\' 10"  (1.778 m)   Wt 255 lb 12.8 oz (116 kg)   BMI 36.70 kg/m  GENERAL: The patient is AO x3, in no acute distress. HEENT: Head is normocephalic and atraumatic. EOMI are intact. Mouth is well hydrated and without lesions. NECK: Supple. No masses LUNGS: Clear to auscultation. No presence of rhonchi/wheezing/rales. Adequate chest expansion HEART: RRR, normal s1 and s2. ABDOMEN: Soft, nontender, no guarding, no peritoneal signs, and nondistended. BS +. No masses.  Imaging/Labs: as above      No data to display         Lab Results  Component Value Date   IRON 119 04/21/2023   TIBC 387 04/21/2023   FERRITIN 1,210 (H) 04/21/2023    I personally reviewed and interpreted the available labs, imaging and endoscopic files.  Ultrasound :   1. No acute abnormality identified. 2. 1 cm nonobstructing stone in the left kidney. 3. Increased echotexture of the liver. This is a nonspecific finding but can be seen in fatty infiltration of liver.  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  Risk factors with BMI 36.8, and diabetes (hemoglobin A1c 7.0), hypertriglyceridemia and increased alcohol intake.  Patient have stopped using any alcohol since February and continue to have elevated liver enzymes  Patient has hepatocellular pattern elevation liver enzymes. AST :54 ALT 78:    Recommendation   Will start patient on Resmetirom 100mg  daily given F2 Fibrosis with Severe Steatosis and Severe NASH   Vaccinated against hepatitis CMP with PCP  Talk to PCP about GLP 1 agonist as well  #BMI 36  Had extensive discussion with patient about lifestyle changes      - walking at a brisk pace/biking at moderate intesity 2.5-5 hours per week     - use pedometer/step counter to track activity     - goal to lose 5-10% of initial body weight     - avoid suagry drinks and juices, use zero calorie beverages     - increase water  intake     - eat a low carb diet with plenty of veggies and fruit     - Get sufficient sleep 7-8 hrs nightly     - maitain active lifestyle     - avoid alcohol     - recommend 2-3 cups Coffee daily     - Counsel on lowering cholesterol by having a diet rich in vegetables,          protein (avoid red meats) and good fats(fish, salmon).  #History of colon polyps  Last colonoscopy 2022 with 3 TA's suggest repeat  5 years     All questions were answered.      Tiffanni Scarfo Faizan Tanekia Ryans, MD Gastroenterology and Hepatology [facility]   This chart has been completed using [facility] Dictation software, and while attempts have been made to ensure accuracy , certain words and phrases may not be transcribed as intended

## 2023-05-21 ENCOUNTER — Telehealth (INDEPENDENT_AMBULATORY_CARE_PROVIDER_SITE_OTHER): Payer: Self-pay | Admitting: *Deleted

## 2023-05-21 NOTE — Telephone Encounter (Signed)
 Patient seen on 4/30 and prescribed rezdifra. Dr Alita Irwin sent to [facility] specialty. I called today to check status and pharmacy is closed at 10:03 am. I had to leave a message on pharmacy voicemail asking for status of rx. Is this something they can fill for this patient and also if it need a PA to please send it. I left my number and our fax number for them to follow up on.

## 2023-05-25 ENCOUNTER — Telehealth (INDEPENDENT_AMBULATORY_CARE_PROVIDER_SITE_OTHER): Payer: Self-pay

## 2023-05-25 NOTE — Telephone Encounter (Signed)
 [employer] its [employer] [address] [address] [place] [address] 05/25/2023 Jimmy Rose [address] [place], [place] [address] Confidential UM Information for: Member Name: Jimmy Rose Date of Birth: 01-31-1965 Date Created: 05/21/2023 Reference Number: [id number] Medication: REZDIFFRA  100 MG TABLET Provider: Terril Rose Denial Reason: Medical Necessity Important information about the request you or your doctor asked us  to review. [employer] provides utilization management services for [employer] and [employer] its [employer]. Dear Jimmy Rose: Recently, you or your doctor asked us  to review a request for medication. We reviewed the request and it is not approved. We'd like to explain why. We denied your request because we did not see what we need to approve the drug you asked for, (Rezdiffra ). We may be able to approve this drug when we see certain records ([documentation] your illness has been confirmed based on a certain test [liver biopsy]). If we receive these records, we may need more information (if records show that you have a certain test score for your illness; if records show you have a certain stage of illness; what kind of doctor prescribed this drug for you; if you have tried a certain treatment program; if you will keep using a certain treatment program; if you have a certain illness, if you have a certain test score; if you have certain lab levels; other drugs you are using). We based this decision on your health plan's prior authorization clinical criteria named Rezdiffra  (resmetirom ). Medications that are not medically necessary are an exclusion under your plan benefits and are not covered. You Should Know It might help you better understand how your plan works if you know how the decision was made. This review was completed by [name], Rph. They can be reached by  phone at [phone]. They consider many things when making a decision:  Your health status  Clinical criteria or guidelines  Your health plan  They may also consider the latest findings in medical journals and proven research There are several ways you, your provider or your authorized representative can get a free copy of the clinical criteria mentioned above.  Plan clinical criteria  Log into [email or web address].  Call the number on your identification card Please refer to the definition and exclusion sections of your plan benefits for information on medically necessary services. What's Next  This decision means this medication is not covered by your health plan. It doesn't mean that you should stop getting medical care. Only you and your doctor can decide what's best for you. If you have any questions about your benefits, you can call the Member Service number on your ID card.  You can appeal this decision if you or your provider disagrees with it. We're including appeal information with this letter.  We've told your provider about this decision and they'll receive the information in this letter. If they'd like to provide more information about your case, they can call our clinical reviewer at [phone]. Attention Provider: If you'd like to discuss our decision with our clinical reviewer and you haven't done so, please contact us  at [phone] before all related appeals are initiated. If you would like to submit additional information related to this case, fax us  at [phone]. Please provide the following:  Patient's name  Reference number (from top of this letter)  Medication  Date of service  Member Policy ID Number You may receive a reconsideration of this medication if you  have additional information that might support its medical necessity. Please submit the information within 10 business days of the date of this letter. This reconsideration  doesn't delay or replace other appeal rights that may be available. One reconsideration and one peer to peer discussion are available. Sincerely, [employer] Enclosure: Your Appeal or Grievance Rights CC: LYDON WENZINGER Providers: You are required to return, destroy or further protect any PHI received on this document pertaining to members that you are not currently treating. Providers are required to immediately destroy any such PHI or safeguard the PHI for as long as it is retained. In no event are you permitted to use or re-disclose

## 2023-05-25 NOTE — Telephone Encounter (Signed)
 [employer] its [employer] [address] [address] [place] [address] 05/25/2023 [name] [address] [place], [place] [address] Confidential UM Information for: Member Name: ALVONTE PIKER Date of Birth: 12-15-1965 Date Created: 05/21/2023 Reference Number: [id number] Medication: REZDIFFRA  100 MG TABLET Provider: [name] Denial Reason: Medical Necessity Important information about the request you or your doctor asked us  to review. [employer] provides utilization management services for [employer] and [employer] its [employer]. Dear [name]: Recently, you or your doctor asked us  to review a request for medication. We reviewed the request and it is not approved. We'd like to explain why. We denied your request because we did not see what we need to approve the drug you asked for, (Rezdiffra ). We may be able to approve this drug when we see certain records ([documentation] your illness has been confirmed based on a certain test [liver biopsy]). If we receive these records, we may need more information (if records show that you have a certain test score for your illness; if records show you have a certain stage of illness; what kind of doctor prescribed this drug for you; if you have tried a certain treatment program; if you will keep using a certain treatment program; if you have a certain illness, if you have a certain test score; if you have certain lab levels; other drugs you are using). We based this decision on your health plan's prior authorization clinical criteria named Rezdiffra  (resmetirom ). Medications that are not medically necessary are an exclusion under your plan benefits and are not covered. You Should Know It might help you better understand how your plan works if you know how the decision was made. This review was completed by [name], Rph. They can be reached by  phone at [phone]. They consider many things when making a decision:  Your health status  Clinical criteria or guidelines  Your health plan  They may also consider the latest findings in medical journals and proven research There are several ways you, your provider or your authorized representative can get a free copy of the clinical criteria mentioned above.  Plan clinical criteria  Log into [email or web address].  Call the number on your identification card Please refer to the definition and exclusion sections of your plan benefits for information on medically necessary services. What's Next  This decision means this medication is not covered by your health plan. It doesn't mean that you should stop getting medical care. Only you and your doctor can decide what's best for you. If you have any questions about your benefits, you can call the Member Service number on your ID card.  You can appeal this decision if you or your provider disagrees with it. We're including appeal information with this letter.  We've told your provider about this decision and they'll receive the information in this letter. If they'd like to provide more information about your case, they can call our clinical reviewer at [phone]. Attention Provider: If you'd like to discuss our decision with our clinical reviewer and you haven't done so, please contact us  at [phone] before all related appeals are initiated. If you would like to submit additional information related to this case, fax us  at [phone]. Please provide the following:  Patient's name  Reference number (from top of this letter)  Medication  Date of service  Member Policy ID Number You may receive a reconsideration of this medication if you  have additional information that might support its medical necessity. Please submit the information within 10 business days of the date of this letter. This reconsideration  doesn't delay or replace other appeal rights that may be available. One reconsideration and one peer to peer discussion are available. Sincerely, [employer] Enclosure: Your Appeal or Grievance Rights CC: KAESEN SHELLENBARGER Providers: You are required to return, destroy or further protect any PHI received on this document pertaining to members that you are not currently treating. Providers are required to immediately destroy any such PHI or safeguard the PHI for as long as it is retained. In no event are you permitted to use or re-disclose

## 2023-05-27 ENCOUNTER — Encounter (INDEPENDENT_AMBULATORY_CARE_PROVIDER_SITE_OTHER): Payer: Self-pay | Admitting: *Deleted

## 2023-05-28 NOTE — Telephone Encounter (Signed)
 Dr. Alita Irwin the letter of denial is on your desk to review.

## 2023-06-01 ENCOUNTER — Other Ambulatory Visit (INDEPENDENT_AMBULATORY_CARE_PROVIDER_SITE_OTHER): Payer: Self-pay | Admitting: Gastroenterology

## 2023-06-01 DIAGNOSIS — K7581 Nonalcoholic steatohepatitis (NASH): Secondary | ICD-10-CM

## 2023-06-01 DIAGNOSIS — R748 Abnormal levels of other serum enzymes: Secondary | ICD-10-CM

## 2023-06-01 DIAGNOSIS — K760 Fatty (change of) liver, not elsewhere classified: Secondary | ICD-10-CM

## 2023-06-01 NOTE — Telephone Encounter (Signed)
 Jimmy Rose to get the information for Dr. Alita Irwin and call me with this.    I spoke with Jimmy Rose the Drug rep for Rezdiffra , he says the [facility] may still require a liver biopsy to approve this medication. Per Jimmy Rose, He can get the information for Dr. Alita Irwin to push back on this with [facility] requiring a biopsy, as the liver biopsy is too invasive and may cause un needed harm to the patient. I made Dr. Alita Irwin aware of this and we are still going to proceed with the ELF testing and the elastography, but he would like to get the information that is needed to protest this requirement of Jimmy Rose.

## 2023-06-01 NOTE — Telephone Encounter (Signed)
 I spoke with the patient and made him aware his insurance is wanting more labs and scans in order to approve the Rezdiffra . He states understanding that he will need to have the ELF testing done at [facility], and someone from the office would be in contact with him to set up the elastography.

## 2023-06-01 NOTE — Telephone Encounter (Signed)
 Thanks , appears [facility] wants liver biopsy to proof patient having fibrosis . There are other non-invasive ways to establish this as well . I will order ELF test and Elastrography to be done and depending on results will appeal for the medication

## 2023-06-02 ENCOUNTER — Encounter (INDEPENDENT_AMBULATORY_CARE_PROVIDER_SITE_OTHER): Payer: Self-pay

## 2023-06-02 NOTE — Telephone Encounter (Signed)
 Also sent mychart message to pt.

## 2023-06-02 NOTE — Telephone Encounter (Signed)
 US  scheduled for 06/09/23 at 9:30am. Pt is to arrive at 9:15am to [facility]. NPO 6 hours prior to procedure. Left message to return call

## 2023-06-03 ENCOUNTER — Encounter (INDEPENDENT_AMBULATORY_CARE_PROVIDER_SITE_OTHER): Payer: Self-pay

## 2023-06-03 NOTE — Telephone Encounter (Signed)
 Letter mailed to pt.

## 2023-06-03 NOTE — Telephone Encounter (Signed)
 [facility] (Rep for Rezdiffra ) called the office back regarding the peer to peer review. He says the number is listed on the denial letter. I have looked and there is a number for you to call and set up a date and time you can discuss this patient's denial of Rezdiffra  and their request for a liver biopsy to get this covered. This will need to be done prior to doing a denial letter. If a denial letter is sent to them they will not allow a peer to peer review. Please advise, what is the next steps you would like to take on this patient to get him approved for Rezdiffra . The phone number to [facility] peer to peer line is [phone]. Thanks,

## 2023-06-03 NOTE — Telephone Encounter (Signed)
 Left message for to return call to give US  appt

## 2023-06-09 ENCOUNTER — Encounter (INDEPENDENT_AMBULATORY_CARE_PROVIDER_SITE_OTHER): Payer: Self-pay | Admitting: Gastroenterology

## 2023-06-09 ENCOUNTER — Ambulatory Visit (HOSPITAL_COMMUNITY): Admission: RE | Admit: 2023-06-09 | Source: Ambulatory Visit

## 2023-06-09 DIAGNOSIS — K7581 Nonalcoholic steatohepatitis (NASH): Secondary | ICD-10-CM

## 2023-06-09 DIAGNOSIS — K760 Fatty (change of) liver, not elsewhere classified: Secondary | ICD-10-CM

## 2023-06-09 DIAGNOSIS — R748 Abnormal levels of other serum enzymes: Secondary | ICD-10-CM

## 2023-06-24 NOTE — Telephone Encounter (Signed)
 Hey Dr. Alita Irwin, I wanted to just follow up on this patient. Were you planning to do a peer to peer for this patient in regards to the insurance stating a biopsy was needed in order to approve Rezdiffra ? (Please see notes below). Thanks

## 2023-06-25 NOTE — Telephone Encounter (Signed)
 So there is a phone number to call for a peer to peer review the number is [phone]. Call this number and tell them you want to do a peer to peer review on this patient. I will put this form on your desk.

## 2023-07-20 NOTE — Telephone Encounter (Signed)
 I attempted to initiate peer to peer and they said since its been more than 30 days . New prior auth needs to be done before peer to peer.Jimmy Rose,  Can you please schedule a follow up appointment for this patient in 4-6 weeks with me or any of the APPs?  Thanks,  Cindy Fullman Faizan Wafaa Deemer , MD Gastroenterology and Hepatology [facility] Gastroenterology

## 2023-07-20 NOTE — Telephone Encounter (Signed)
 Jimmy Rose, if possible can you get this patient in as soon as we can preferably with Dr. Cinderella since he knows this patient's case and what is going on with the Prior Auth and the need for further testing to get the patient medication approved? Thanks,

## 2023-08-02 NOTE — Telephone Encounter (Signed)
 Patient called 08/05/2023 to cancel his appointment that had been scheduled with Dr. Cinderella stating [place] would [place] until [place] 2025. I spoke with Dr. Cinderella regarding the patient cancelling the appointment also made Dr. Cinderella aware that the patient did not have his ELF lab work done, nor had he showed up for the [facility] he was scheduled for. Per Dr. Cinderella once the patient reschedules the appointment we will re visit him starting on Rezdiffra .

## 2023-08-03 ENCOUNTER — Ambulatory Visit (INDEPENDENT_AMBULATORY_CARE_PROVIDER_SITE_OTHER): Admitting: Gastroenterology

## 2023-09-02 ENCOUNTER — Encounter (INDEPENDENT_AMBULATORY_CARE_PROVIDER_SITE_OTHER): Payer: Self-pay | Admitting: Gastroenterology
